# Patient Record
Sex: Female | Born: 1980 | Race: White | Hispanic: No | Marital: Married | Smoking: Former smoker
Health system: Southern US, Community
[De-identification: ages and names within clinical notes are randomized; demographics above are authoritative.]

## PROBLEM LIST (undated history)

## (undated) ENCOUNTER — Inpatient Hospital Stay (HOSPITAL_COMMUNITY): Payer: Self-pay

## (undated) DIAGNOSIS — O409XX0 Polyhydramnios, unspecified trimester, not applicable or unspecified: Secondary | ICD-10-CM

## (undated) DIAGNOSIS — F502 Bulimia nervosa, unspecified: Secondary | ICD-10-CM

## (undated) DIAGNOSIS — Z8619 Personal history of other infectious and parasitic diseases: Secondary | ICD-10-CM

## (undated) DIAGNOSIS — R569 Unspecified convulsions: Secondary | ICD-10-CM

## (undated) DIAGNOSIS — E559 Vitamin D deficiency, unspecified: Secondary | ICD-10-CM

## (undated) DIAGNOSIS — G40909 Epilepsy, unspecified, not intractable, without status epilepticus: Secondary | ICD-10-CM

## (undated) DIAGNOSIS — F419 Anxiety disorder, unspecified: Secondary | ICD-10-CM

## (undated) DIAGNOSIS — C439 Malignant melanoma of skin, unspecified: Secondary | ICD-10-CM

## (undated) DIAGNOSIS — Z789 Other specified health status: Secondary | ICD-10-CM

## (undated) DIAGNOSIS — T1491XA Suicide attempt, initial encounter: Secondary | ICD-10-CM

## (undated) DIAGNOSIS — K5792 Diverticulitis of intestine, part unspecified, without perforation or abscess without bleeding: Secondary | ICD-10-CM

## (undated) HISTORY — DX: Bulimia nervosa, unspecified: F50.20

## (undated) HISTORY — DX: Epilepsy, unspecified, not intractable, without status epilepticus: G40.909

## (undated) HISTORY — DX: Vitamin D deficiency, unspecified: E55.9

## (undated) HISTORY — DX: Personal history of other infectious and parasitic diseases: Z86.19

## (undated) HISTORY — DX: Bulimia nervosa: F50.2

## (undated) HISTORY — DX: Other specified health status: Z78.9

## (undated) HISTORY — DX: Malignant melanoma of skin, unspecified: C43.9

## (undated) HISTORY — DX: Polyhydramnios, unspecified trimester, not applicable or unspecified: O40.9XX0

## (undated) HISTORY — DX: Diverticulitis of intestine, part unspecified, without perforation or abscess without bleeding: K57.92

## (undated) HISTORY — DX: Unspecified convulsions: R56.9

## (undated) HISTORY — DX: Anxiety disorder, unspecified: F41.9

## (undated) HISTORY — DX: Suicide attempt, initial encounter: T14.91XA

---

## 2002-03-23 ENCOUNTER — Emergency Department (HOSPITAL_COMMUNITY): Admission: EM | Admit: 2002-03-23 | Discharge: 2002-03-23 | Payer: Self-pay

## 2002-05-20 ENCOUNTER — Other Ambulatory Visit: Admission: RE | Admit: 2002-05-20 | Discharge: 2002-05-20 | Payer: Self-pay | Admitting: *Deleted

## 2002-08-01 ENCOUNTER — Encounter: Payer: Self-pay | Admitting: Emergency Medicine

## 2002-08-01 ENCOUNTER — Emergency Department (HOSPITAL_COMMUNITY): Admission: EM | Admit: 2002-08-01 | Discharge: 2002-08-01 | Payer: Self-pay | Admitting: Emergency Medicine

## 2003-06-04 ENCOUNTER — Other Ambulatory Visit: Admission: RE | Admit: 2003-06-04 | Discharge: 2003-06-04 | Payer: Self-pay | Admitting: Gynecology

## 2003-10-06 DIAGNOSIS — T1491XA Suicide attempt, initial encounter: Secondary | ICD-10-CM

## 2003-10-06 HISTORY — DX: Suicide attempt, initial encounter: T14.91XA

## 2003-10-09 ENCOUNTER — Inpatient Hospital Stay (HOSPITAL_COMMUNITY): Admission: EM | Admit: 2003-10-09 | Discharge: 2003-10-10 | Payer: Self-pay | Admitting: Emergency Medicine

## 2003-10-10 ENCOUNTER — Inpatient Hospital Stay (HOSPITAL_COMMUNITY): Admission: EM | Admit: 2003-10-10 | Discharge: 2003-10-13 | Payer: Self-pay | Admitting: Psychiatry

## 2004-03-03 ENCOUNTER — Emergency Department (HOSPITAL_COMMUNITY): Admission: EM | Admit: 2004-03-03 | Discharge: 2004-03-03 | Payer: Self-pay | Admitting: Emergency Medicine

## 2004-03-05 DIAGNOSIS — G40909 Epilepsy, unspecified, not intractable, without status epilepticus: Secondary | ICD-10-CM

## 2004-03-05 HISTORY — DX: Epilepsy, unspecified, not intractable, without status epilepticus: G40.909

## 2004-06-05 HISTORY — PX: WISDOM TOOTH EXTRACTION: SHX21

## 2004-06-12 ENCOUNTER — Other Ambulatory Visit: Admission: RE | Admit: 2004-06-12 | Discharge: 2004-06-12 | Payer: Self-pay | Admitting: Gynecology

## 2004-11-24 ENCOUNTER — Emergency Department (HOSPITAL_COMMUNITY): Admission: EM | Admit: 2004-11-24 | Discharge: 2004-11-25 | Payer: Self-pay | Admitting: Emergency Medicine

## 2005-07-26 ENCOUNTER — Other Ambulatory Visit: Admission: RE | Admit: 2005-07-26 | Discharge: 2005-07-26 | Payer: Self-pay | Admitting: Gynecology

## 2005-10-26 ENCOUNTER — Other Ambulatory Visit: Admission: RE | Admit: 2005-10-26 | Discharge: 2005-10-26 | Payer: Self-pay | Admitting: Gynecology

## 2005-11-23 ENCOUNTER — Emergency Department (HOSPITAL_COMMUNITY): Admission: EM | Admit: 2005-11-23 | Discharge: 2005-11-24 | Payer: Self-pay | Admitting: Emergency Medicine

## 2006-07-31 ENCOUNTER — Other Ambulatory Visit: Admission: RE | Admit: 2006-07-31 | Discharge: 2006-07-31 | Payer: Self-pay | Admitting: Gynecology

## 2007-08-04 ENCOUNTER — Other Ambulatory Visit: Admission: RE | Admit: 2007-08-04 | Discharge: 2007-08-04 | Payer: Self-pay | Admitting: Gynecology

## 2007-11-06 DIAGNOSIS — Z789 Other specified health status: Secondary | ICD-10-CM

## 2007-11-06 HISTORY — DX: Other specified health status: Z78.9

## 2008-09-06 ENCOUNTER — Ambulatory Visit: Payer: Self-pay | Admitting: Women's Health

## 2008-09-06 ENCOUNTER — Other Ambulatory Visit: Admission: RE | Admit: 2008-09-06 | Discharge: 2008-09-06 | Payer: Self-pay | Admitting: Gynecology

## 2008-09-06 ENCOUNTER — Encounter: Payer: Self-pay | Admitting: Women's Health

## 2009-09-21 ENCOUNTER — Other Ambulatory Visit: Admission: RE | Admit: 2009-09-21 | Discharge: 2009-09-21 | Payer: Self-pay | Admitting: Gynecology

## 2009-09-21 ENCOUNTER — Ambulatory Visit: Payer: Self-pay | Admitting: Women's Health

## 2010-10-05 ENCOUNTER — Ambulatory Visit: Payer: Self-pay | Admitting: Women's Health

## 2010-10-05 ENCOUNTER — Other Ambulatory Visit
Admission: RE | Admit: 2010-10-05 | Discharge: 2010-10-05 | Payer: Self-pay | Source: Home / Self Care | Admitting: Gynecology

## 2011-02-04 ENCOUNTER — Emergency Department (HOSPITAL_COMMUNITY)
Admission: EM | Admit: 2011-02-04 | Discharge: 2011-02-04 | Disposition: A | Discharge status: Home / Self Care | Payer: 59 | Attending: Emergency Medicine | Admitting: Emergency Medicine

## 2011-02-04 DIAGNOSIS — Z79899 Other long term (current) drug therapy: Secondary | ICD-10-CM | POA: Insufficient documentation

## 2011-02-04 DIAGNOSIS — F329 Major depressive disorder, single episode, unspecified: Secondary | ICD-10-CM | POA: Insufficient documentation

## 2011-02-04 DIAGNOSIS — F3289 Other specified depressive episodes: Secondary | ICD-10-CM | POA: Insufficient documentation

## 2011-02-04 DIAGNOSIS — G40909 Epilepsy, unspecified, not intractable, without status epilepticus: Secondary | ICD-10-CM | POA: Insufficient documentation

## 2011-03-23 NOTE — Discharge Summary (Signed)
NAME:  Natasha Watson, Natasha Watson NO.:  [id number]   MEDICAL RECORD NO.:  [id number]                   PATIENT TYPE:  IPS   LOCATION:  0501                                 FACILITY:  [facility]   PHYSICIAN:  Geoffery Lyons, M.D.                   DATE OF BIRTH:  1981/06/26   DATE OF ADMISSION:  10/10/2003  DATE OF DISCHARGE:  10/13/2003                                 DISCHARGE SUMMARY   CHIEF COMPLAINT AND PRESENT ILLNESS:  This was the first admission to [facility] for this 30 year old white female, single,  voluntarily admitted.  She graduated in May from [employer], juggling two jobs and  a boyfriend.  She got drunk, had a fight with the boyfriend over the  relationship, took and overdose of Motrin, Tylenol, and Prozac because no  reason to live.  She was overwhelmed by life and the job, endorsed  obsessive thinking about weight, how fat she was.  She had to two weeks of  irritability, anhedonia, helplessness, worthlessness.   PAST PSYCHIATRIC HISTORY:  This was the first time at [facility], no current treatment.  She had been treated since she was 54 with  Prozac.   SUBSTANCE ABUSE HISTORY:  As already stated, rare use of alcohol but some  binge drinking.   PAST MEDICAL HISTORY:  Noncontributory.   MEDICATIONS:  Prozac 20 mg daily.   PHYSICAL EXAMINATION:  Physical examination was performed, failed to show  any acute findings.   MENTAL STATUS EXAM:  Mental status exam revealed a fully alert, pleasant  female, tearful, constricted affect.  Speech: Normal rate, production,  tempo.  Mood: Depressed.  Affect: Depressed, tearful, worthless, helpless,  hopeless.  Positive suicidal ruminations with no plan, no homicidal ideas,  no psychosis.  Cognitive: Cognition was well preserved.   ADMISSION DIAGNOSES:   AXIS I:  Major depression, recurrent.   AXIS II:  No diagnosis.   AXIS III:  1. Status post acetaminophen overdose.  2.  Hypokalemia.   AXIS IV:  Moderate.   AXIS V:  Global assessment of functioning upon admission 38, highest global  assessment of functioning in the last year 72.   LABORATORY DATA:  CBC was within normal limits.  Blood chemistries were  within normal limits.  Thyroid profile was within normal limits.  Urine  pregnancy test was negative.   HOSPITAL COURSE:  She was admitted and started in intensive individual and  group psychotherapy.  She was maintained on her home medications.  She was  hypokalemic so potassium was replaced.  She was maintained on Prozac 30 mg  per day, given Ambien for sleep.  Prozac was increased to 40 mg and she was  given Topamax 25 mg at bedtime.  She did say she was feeling very  overwhelmed, depressed for four weeks, increased stress.  After she  graduated, she had been working  long hours, so exacerbation of her eating  disorder with her depression, arguing with the boyfriend.  She had seven  glasses of wine and then took an overdose.  She could see that she was  getting overwhelmed, unable to cope.  As she was able to talk, open up, she  started feeling better.  Family session with the patient, the mother, the  brother, and boyfriend went well.  She worked on finding different coping  mechanisms.  On December 8, she was in full contact with reality, no  suicidal ideas, no homicidal ideas, no hallucinations, no delusions, willing  and motivated to pursue further outpatient treatment.  She had increased  insight, committed to abstinence, willing to work with a therapist to get  the eating disorder under control.   DISCHARGE DIAGNOSES:   AXIS I:  1. Major depression, recurrent.  2. Eating disorder, not otherwise specified.  3. Alcohol abuse.   AXIS II:  No diagnosis.   AXIS III:  1. Status post overdose.  2. Hypokalemia, corrected.   AXIS IV:  Moderate.   AXIS V:  Global assessment of functioning upon discharge 50-55.   DISCHARGE MEDICATIONS:  1.  Prozac 20 mg two daily.  2. Topamax 25 mg one to two at bedtime.   FOLLOW UP:  She was to follow up with Dr. Magnus Ivan and Dr. Raquel James.                                               Geoffery Lyons, M.D.    IL/MEDQ  D:  10/26/2003  T:  10/28/2003  Job:  [id number]

## 2011-03-23 NOTE — H&P (Signed)
NAME:  Natasha Watson, Natasha Watson NO.:  [id number]   MEDICAL RECORD NO.:  [id number]                   PATIENT TYPE:  INP   LOCATION:  1823                                 FACILITY:  [facility]   PHYSICIAN:  Vania Rea, M.D.              DATE OF BIRTH:  09-23-1981   DATE OF ADMISSION:  10/09/2003  DATE OF DISCHARGE:                                HISTORY & PHYSICAL   CHIEF COMPLAINT:  Multi drug overdose.   HISTORY OF PRESENT ILLNESS:  This is a 30 year old Caucasian lady with a  history of depression and boulimia, taking daily Prozac. Last visit to  psychiatry, she says, is 2003 at [facility]. Usually drinks 1 to 2 drinks of  alcohol per month but went out binge drinking last night because of a  breakup with her boyfriend. Had 6 alcoholic drinks, then went home and had  overdosed on multiple medications at about 1:30 a.m. this morning. The  patient informed her friend that she had overdosed with the intent to kill  herself. The friend told her brother, who brought her to the emergency room  within 1 hour. Estimated drug overdose drugs are Tylenol 12 to 20 tablets  x325 mg, Motrin 200 mg 25 to 30 tablets, Midol 4 to 5 tablets, Robitussin  unknown quantity of 220 ml, and unknown quantity of Prozac, and a few  Imodium capsules. In the emergency room, she was drowsy. She was given  activated charcoal. She took 1 mouthful and vomited. The rest of it was  sitting by her bedside. The patient says she still feels suicidal.   PAST MEDICAL HISTORY:  Depression, boulimia, congenital heart murmur.  Admitted for strep throat in 2003.   ALLERGIES:  DARVOCET (vomiting).   MEDICATIONS:  OCP, Prozac 20 mg daily.   SOCIAL HISTORY:  Denies tobacco use. Alcohol 2 drinks every 3 to 4 weeks.  Drugs marijuana, last used 3 weeks ago. 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   FAMILY HISTORY:  There is a strong family history of breast and ovarian  cancer in her  aunts and grandmothers. So far, her mother has been spared.  Her last period was 3 weeks ago. It was normal.   REVIEW OF SYSTEMS:  Noncontributory.   PHYSICAL EXAMINATION:  GENERAL:  She is a drowsy, depressed lady but able to  respond to questions. She is in no respiratory distress. She very often says  I don't want to gain weight. That is not entirely appropriate at times in  the conversation.  VITAL SIGNS:  Temperature 98.7, pulse 98, respiratory rate 18, blood  pressure 110/52. Saturation 99% on room air.  HEENT:  Pupils are equal, round, and reactive to light. She is pink. Mildly  dehydrated.  CHEST:  Clear to auscultation bilaterally.  CARDIOVASCULAR:  Regular rhythm.  ABDOMEN:  Soft, nontender. She has a pierced Higher education careers adviser with studs in it.  EXTREMITIES:  No edema, 3+ pulses.  CNS:  She is drowsy but alert and oriented x3. No focal deficits.   LABORATORY DATA:  Hemoglobin 12.5, hematocrit 37.5, white count 5.4,  platelets 326,000. Chemistry sodium 141, potassium 3.4, chloride 111, CO2  22, BUN 8, creatinine 0.6, calcium 8.6, glucose 105. Urine toxicity  negative. Beta HCG urine negative. Salicylates less than 4. Her  acetaminophen level at 2:35 a.m. was 57.4 and at 4:15 a.m. 68.2. Her alcohol  level at 4:15 was 120. Her liver function studies at 2:35 a.m. and 4:15 a.m.  were all normal and alkaline phosphatase, AST, and ALT were identical and  normal at 51, 23, and 21. A total bilirubin 0.2 and 0.3.   ASSESSMENT:  Multi drug overdose. Needs monitoring for the late effects of  acetaminophen and for the effects of Prozac and Ibuprofen. For the  acetaminophen, she has not really had any charcoal. She has had Imodium,  which slows the transit time in the stomach and her blood levels over the  critical level of 60. Will start her on a 17 dose regimen of Mucomyst now  and admit to the Intensive Care Unit. Will monitor her liver function  studies and her Tylenol level, particularly in  combination with alcohol. For  the Ibuprofen, will start her on IV Protonix and she is drowsy. Will monitor  her hemoglobin and hematocrit every 6 hours initially. For the Prozac, will  monitor her EKG. Currently, her EKG is showing sinus tachycardia. No Q wave  or QT changes at this time.                                                Vania Rea, M.D.    LC/MEDQ  D:  10/09/2003  T:  10/09/2003  Job:  [id number]

## 2011-10-03 DIAGNOSIS — F502 Bulimia nervosa: Secondary | ICD-10-CM | POA: Insufficient documentation

## 2011-10-03 DIAGNOSIS — G40909 Epilepsy, unspecified, not intractable, without status epilepticus: Secondary | ICD-10-CM | POA: Insufficient documentation

## 2011-10-03 DIAGNOSIS — T1491XA Suicide attempt, initial encounter: Secondary | ICD-10-CM | POA: Insufficient documentation

## 2011-10-03 DIAGNOSIS — Z789 Other specified health status: Secondary | ICD-10-CM | POA: Insufficient documentation

## 2011-10-11 ENCOUNTER — Other Ambulatory Visit (HOSPITAL_COMMUNITY)
Admission: RE | Admit: 2011-10-11 | Discharge: 2011-10-11 | Disposition: A | Discharge status: Home / Self Care | Payer: BC Managed Care – PPO | Source: Ambulatory Visit | Attending: Gynecology | Admitting: Gynecology

## 2011-10-11 ENCOUNTER — Ambulatory Visit (INDEPENDENT_AMBULATORY_CARE_PROVIDER_SITE_OTHER): Payer: BC Managed Care – PPO | Admitting: Women's Health

## 2011-10-11 ENCOUNTER — Encounter: Payer: Self-pay | Admitting: Women's Health

## 2011-10-11 VITALS — BP 110/68 | Ht 60.25 in | Wt 126.0 lb

## 2011-10-11 DIAGNOSIS — Z23 Encounter for immunization: Secondary | ICD-10-CM

## 2011-10-11 DIAGNOSIS — Z01419 Encounter for gynecological examination (general) (routine) without abnormal findings: Secondary | ICD-10-CM | POA: Insufficient documentation

## 2011-10-11 NOTE — Progress Notes (Signed)
Addended by: Richardson Chiquito on: 10/11/2011 09:35 AM   Modules accepted: Orders

## 2011-10-11 NOTE — Progress Notes (Signed)
Natasha Watson 29-May-1981 [id number]    History:    The patient presents for annual exam.  Works for an Pensions consultant and doing well.   Past medical history, past surgical history, family history and social history were all reviewed and documented in the EPIC chart.   ROS:  A  ROS was performed and pertinent positives and negatives are included in the history.  Exam:  Filed Vitals:   10/11/11 0804  BP: 110/68    General appearance:  Normal Head/Neck:  Normal, without cervical or supraclavicular adenopathy. Thyroid:  Symmetrical, normal in size, without palpable masses or nodularity. Respiratory  Effort:  Normal  Auscultation:  Clear without wheezing or rhonchi Cardiovascular  Auscultation:  Regular rate, without rubs, murmurs or gallops  Edema/varicosities:  Not grossly evident Abdominal  Soft,nontender, without masses, guarding or rebound.  Liver/spleen:  No organomegaly noted  Hernia:  None appreciated  Skin  Inspection:  Grossly normal  Palpation:  Grossly normal Neurologic/psychiatric  Orientation:  Normal with appropriate conversation.  Mood/affect:  Normal  Genitourinary    Breasts: Examined lying and sitting.     Right: Without masses, retractions, discharge or axillary adenopathy.     Left: Without masses, retractions, discharge or axillary adenopathy.   Inguinal/mons:  Normal without inguinal adenopathy  External genitalia:  Normal  BUS/Urethra/Skene's glands:  Normal  Bladder:  Normal  Vagina:  Normal  Cervix:  Normal  Uterus:  normal in size, shape and contour.  Midline and mobile  Adnexa/parametria:     Rt: Without masses or tenderness.   Lt: Without masses or tenderness.  Anus and perineum: Normal  Digital rectal exam: Normal sphincter tone without palpated masses or tenderness  Assessment/Plan:  30 y.o. MWF G0 for annual exam without complaint. Monthly 3 day cycle/condoms. History of seizures had been seizure-free for years until April 2012 had  one after missing dose of lamictal and increased fatigue. Rubella immune, Gardasil series completed in 07. History of ascus with negative HR HPV in 06, normal Paps after. History of mother with breast cancer at age 21, doing well unknown BRCA status, will discuss with mother. History of depression, no medications, denies need for counseling.  Normal GYN exam Seizure disorder/stable/Dr. Dohmierer  Plan: Contraception options reviewed declines, prefers to continue with condoms. Not ready to conceive but is starting to think about it, as discussed Lamictal, category C. medication with neurologist in relationship to pregnancy.  SBEs, continue exercise and healthy lifestyle, folic acid. CBC and Pap.  Harrington Challenger [facility], 8:47 AM 10/11/2011

## 2012-05-18 ENCOUNTER — Encounter (HOSPITAL_BASED_OUTPATIENT_CLINIC_OR_DEPARTMENT_OTHER): Payer: Self-pay | Admitting: *Deleted

## 2012-05-18 ENCOUNTER — Emergency Department (HOSPITAL_BASED_OUTPATIENT_CLINIC_OR_DEPARTMENT_OTHER)
Admission: EM | Admit: 2012-05-18 | Discharge: 2012-05-19 | Disposition: A | Discharge status: Home / Self Care | Payer: BC Managed Care – PPO | Attending: Emergency Medicine | Admitting: Emergency Medicine

## 2012-05-18 DIAGNOSIS — Z87891 Personal history of nicotine dependence: Secondary | ICD-10-CM | POA: Insufficient documentation

## 2012-05-18 DIAGNOSIS — G40909 Epilepsy, unspecified, not intractable, without status epilepticus: Secondary | ICD-10-CM | POA: Insufficient documentation

## 2012-05-18 DIAGNOSIS — E860 Dehydration: Secondary | ICD-10-CM

## 2012-05-18 DIAGNOSIS — K529 Noninfective gastroenteritis and colitis, unspecified: Secondary | ICD-10-CM

## 2012-05-18 DIAGNOSIS — R1110 Vomiting, unspecified: Secondary | ICD-10-CM | POA: Insufficient documentation

## 2012-05-18 MED ORDER — ONDANSETRON HCL 4 MG/2ML IJ SOLN
4.0000 mg | Freq: Once | INTRAMUSCULAR | Status: AC
  Administered 2012-05-18: 4 mg via INTRAVENOUS
  Filled 2012-05-18: qty 2

## 2012-05-18 MED ORDER — SODIUM CHLORIDE 0.9 % IV BOLUS (SEPSIS)
1000.0000 mL | Freq: Once | INTRAVENOUS | Status: AC
  Administered 2012-05-18: 1000 mL via INTRAVENOUS

## 2012-05-18 NOTE — ED Notes (Signed)
Friday/Sat-diarrhea. Abd pain and vomiting onset early this a.m.

## 2012-05-19 LAB — URINALYSIS, ROUTINE W REFLEX MICROSCOPIC
Glucose, UA: NEGATIVE mg/dL
Ketones, ur: 40 mg/dL — AB
Leukocytes, UA: NEGATIVE
Nitrite: NEGATIVE
Protein, ur: 100 mg/dL — AB
Specific Gravity, Urine: 1.04 — ABNORMAL HIGH (ref 1.005–1.030)
Urobilinogen, UA: 0.2 mg/dL (ref 0.0–1.0)
pH: 6 (ref 5.0–8.0)

## 2012-05-19 LAB — URINE MICROSCOPIC-ADD ON

## 2012-05-19 LAB — PREGNANCY, URINE: Preg Test, Ur: NEGATIVE

## 2012-05-19 MED ORDER — ONDANSETRON 8 MG PO TBDP: 8.0000 mg | ORAL_TABLET | Freq: Three times a day (TID) | ORAL | Status: AC | PRN

## 2012-05-19 MED ORDER — SODIUM CHLORIDE 0.9 % IV BOLUS (SEPSIS): 1000.0000 mL | Freq: Once | INTRAVENOUS | Status: DC

## 2012-05-19 MED ORDER — ONDANSETRON HCL 4 MG/2ML IJ SOLN
4.0000 mg | Freq: Once | INTRAMUSCULAR | Status: AC
  Administered 2012-05-19: 4 mg via INTRAVENOUS
  Filled 2012-05-19: qty 2

## 2012-05-19 NOTE — ED Provider Notes (Signed)
History   This chart was scribed for Hanley Seamen, MD by Shari Heritage. The patient was seen in room MH08/MH08. Patient's care was started at 2307.    CSN: [id number]  Arrival date & time 05/18/12  2307   First MD Initiated Contact with Patient 05/19/12 0015      Chief Complaint  Patient presents with  . Vomiting     (Consider location/radiation/quality/duration/timing/severity/associated sxs/prior treatment) The history is provided by the patient. No language interpreter was used.   Natasha Watson is a 31 y.o. female who presents to the Emergency Department complaining constant, moderate abdominal pain and persistent vomiting onset 24 hours ago. Patient also experienced persistent diarrhea for two days prior to the onset of nausea, vomiting and abdominal pain. Patient says that she has tried to eat crackers and drink PediaLyte, but she hasn't been able to keep anything down. She has had reduced food intake for the past few days. Patient has decreased urine output and states that her urine has been darker than usual. Patient denies SOB, cough, congestion or rhinorrhea. Patient with medical h/o bulimia, attempted suicide, and seizure disorder. Patient is a former smoker (quit date: 10/10/2001).     Past Medical History  Diagnosis Date  . Bulimia     HISTORY OF  . Attempted suicide 10/2003  . Seizure disorder 03/2004    DR. CARMEN DOHMEIER FOLLOWS HER FOR THIS.  . Rubella immune 2009    Past Surgical History  Procedure Date  . Wisdom tooth extraction 06/2004    Family History  Problem Relation Age of Onset  . Breast cancer Mother 7  . Breast cancer Paternal Aunt   . Cancer Paternal Aunt     OVARIAN OR UTERINE  . Breast cancer Maternal Grandmother     History  Substance Use Topics  . Smoking status: Former Smoker    Quit date: 10/10/2001  . Smokeless tobacco: Never Used  . Alcohol Use: Yes     rarely    OB History    Grav Para Term Preterm Abortions TAB SAB  Ect Mult Living   0               Review of Systems A complete 10 system review of systems was obtained and all systems are negative except as noted in the HPI and PMH.   Allergies  Darvocet  Home Medications   Current Outpatient Rx  Name Route Sig Dispense Refill  . BENZOYL PEROXIDE 8 % EX GEL Apply externally Apply topically.      Marland Kitchen FOLIC ACID 800 MCG PO TABS Oral Take 400 mcg by mouth daily.      Marland Kitchen LAMICTAL PO Oral Take 300 mg by mouth daily.       BP 115/66  Pulse 80  Temp 98.3 F (36.8 C) (Oral)  Resp 20  Ht 5' (1.524 m)  Wt 125 lb (56.7 kg)  BMI 24.41 kg/m2  SpO2 100%  LMP 04/28/2012  Physical Exam  Constitutional: She is oriented to person, place, and time. She appears well-developed and well-nourished.  HENT:  Head: Normocephalic.  Eyes: Conjunctivae are normal. Pupils are equal, round, and reactive to light.  Cardiovascular: Normal rate and regular rhythm.   Pulmonary/Chest: Effort normal and breath sounds normal.  Abdominal: Soft. Bowel sounds are decreased. There is generalized tenderness.       Mild diffuse abdominal pain.  Genitourinary:       Urine is dark yellow.  Neurological: She is alert and  oriented to person, place, and time.  Skin: Skin is warm and dry.  Psychiatric: She has a normal mood and affect.    ED Course  Procedures (including critical care time) DIAGNOSTIC STUDIES: Oxygen Saturation is 100% on room air, normal by my interpretation.    COORDINATION OF CARE: 12:16am- Patient informed of current plan for treatment and evaluation and agrees with plan at this time.       MDM   Nursing notes and vitals signs, including pulse oximetry, reviewed.  Summary of this visit's results, reviewed by myself:  Labs:  Results for orders placed during the hospital encounter of 05/18/12  URINALYSIS, ROUTINE W REFLEX MICROSCOPIC      Component Value Range   Color, Urine AMBER (*) YELLOW   APPearance CLOUDY (*) CLEAR   Specific Gravity,  Urine 1.040 (*) 1.005 - 1.030   pH 6.0  5.0 - 8.0   Glucose, UA NEGATIVE  NEGATIVE mg/dL   Hgb urine dipstick TRACE (*) NEGATIVE   Bilirubin Urine SMALL (*) NEGATIVE   Ketones, ur 40 (*) NEGATIVE mg/dL   Protein, ur 119 (*) NEGATIVE mg/dL   Urobilinogen, UA 0.2  0.0 - 1.0 mg/dL   Nitrite NEGATIVE  NEGATIVE   Leukocytes, UA NEGATIVE  NEGATIVE  PREGNANCY, URINE      Component Value Range   Preg Test, Ur NEGATIVE  NEGATIVE  URINE MICROSCOPIC-ADD ON      Component Value Range   Squamous Epithelial / LPF FEW (*) RARE   WBC, UA 0-2  <3 WBC/hpf   RBC / HPF 3-6  <3 RBC/hpf   Urine-Other MUCOUS PRESENT     1:41 AM Drinking fluids without emesis after IV fluid bolus and Zofran.       I personally performed the services described in this documentation, which was scribed in my presence.  The recorded information has been reviewed and considered.    Hanley Seamen, MD 05/19/12 [id number]

## 2012-10-13 ENCOUNTER — Encounter: Payer: BC Managed Care – PPO | Admitting: Women's Health

## 2012-11-06 ENCOUNTER — Ambulatory Visit (INDEPENDENT_AMBULATORY_CARE_PROVIDER_SITE_OTHER): Payer: BC Managed Care – PPO | Admitting: Women's Health

## 2012-11-06 ENCOUNTER — Encounter: Payer: Self-pay | Admitting: Women's Health

## 2012-11-06 VITALS — BP 112/70 | Ht 60.25 in | Wt 127.0 lb

## 2012-11-06 DIAGNOSIS — G40909 Epilepsy, unspecified, not intractable, without status epilepticus: Secondary | ICD-10-CM

## 2012-11-06 DIAGNOSIS — Z01419 Encounter for gynecological examination (general) (routine) without abnormal findings: Secondary | ICD-10-CM

## 2012-11-06 DIAGNOSIS — X838XXA Intentional self-harm by other specified means, initial encounter: Secondary | ICD-10-CM

## 2012-11-06 DIAGNOSIS — T1491XA Suicide attempt, initial encounter: Secondary | ICD-10-CM

## 2012-11-06 DIAGNOSIS — N926 Irregular menstruation, unspecified: Secondary | ICD-10-CM

## 2012-11-06 LAB — TSH: TSH: 2.844 u[IU]/mL (ref 0.350–4.500)

## 2012-11-06 LAB — PROLACTIN: Prolactin: 10.6 ng/mL

## 2012-11-06 NOTE — Patient Instructions (Signed)

## 2012-11-06 NOTE — Assessment & Plan Note (Signed)
2004

## 2012-11-06 NOTE — Progress Notes (Signed)
Natasha Watson 11-22-1980 [id number]    History:    The patient presents for annual exam.  History of a regular monthly 4- 5 day cycle until March 2013. Cycles spaced to every 4-8 weeks. Used condoms until June 2013. Conception okay. History of a seizure disorder on Lamictal 250 mg daily and folic acid 800 daily. Works in [employer] Dr. Hetty Blend manages. Aware Lamictal  category C. drug, plans to decrease dosage with pregnancy. Last seizure April 2012. History of normal Paps. History of bulimia/depression, denies need for counseling or medication. Gardasil series completed 2007. Had normal labs at primary care several months ago.  Past medical history, past surgical history, family history and social history were all reviewed and documented in the EPIC chart. 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 Rubella immune.   ROS:  A  ROS was performed and pertinent positives and negatives are included in the history.  Exam:  Filed Vitals:   11/06/12 0802  BP: 112/70    General appearance:  Normal Head/Neck:  Normal, without cervical or supraclavicular adenopathy. Thyroid:  Symmetrical, normal in size, without palpable masses or nodularity. Respiratory  Effort:  Normal  Auscultation:  Clear without wheezing or rhonchi Cardiovascular  Auscultation:  Regular rate, without rubs, murmurs or gallops  Edema/varicosities:  Not grossly evident Abdominal  Soft,nontender, without masses, guarding or rebound.  Liver/spleen:  No organomegaly noted  Hernia:  None appreciated  Skin  Inspection:  Grossly normal  Palpation:  Grossly normal Neurologic/psychiatric  Orientation:  Normal with appropriate conversation.  Mood/affect:  Normal  Genitourinary    Breasts: Examined lying and sitting.     Right: Without masses, retractions, discharge or axillary adenopathy.     Left: Without masses, retractions, discharge or axillary adenopathy.   Inguinal/mons:  Normal without inguinal  adenopathy  External genitalia:  Normal  BUS/Urethra/Skene's glands:  Normal  Bladder:  Normal  Vagina:  Normal  Cervix:  Normal  Uterus:   normal in size, shape and contour.  Midline and mobile  Adnexa/parametria:     Rt: Without masses or tenderness.   Lt: Without masses or tenderness.  Anus and perineum: Normal  Digital rectal exam: Normal sphincter tone without palpated masses or tenderness  Assessment/Plan:  32 y.o. M. WF G0 for annual exam.    New onset irregular cycles Epilepsy/seizure free since April 2012-Lamictal per neurologist Dr. Hetty Blend  Plan: TSH, prolactin, Pap normal 2012 new screening guidelines reviewed. SBE's, continue exercise and healthy lifestyle, folic acid, prenatal vitamin, return to office with missed cycle for viability ultrasound. Aware we no longer deliver. Aware of safe behaviors with pregnancy. Instructed to keep menstrual calendar, if cycles space greater than 8 weeks instructed to call.   Harrington Challenger [facility], 8:45 AM 11/06/2012

## 2012-11-06 NOTE — Assessment & Plan Note (Signed)
Dr Hetty Blend manages Natasha Watson Lamictal 250

## 2012-11-07 LAB — URINALYSIS W MICROSCOPIC + REFLEX CULTURE
Bacteria, UA: NONE SEEN
Bilirubin Urine: NEGATIVE
Casts: NONE SEEN
Crystals: NONE SEEN
Glucose, UA: NEGATIVE mg/dL
Hgb urine dipstick: NEGATIVE
Ketones, ur: NEGATIVE mg/dL
Leukocytes, UA: NEGATIVE
Nitrite: NEGATIVE
Protein, ur: NEGATIVE mg/dL
Specific Gravity, Urine: 1.029 (ref 1.005–1.030)
Squamous Epithelial / HPF: NONE SEEN
Urobilinogen, UA: 0.2 mg/dL (ref 0.0–1.0)
pH: 5.5 (ref 5.0–8.0)

## 2013-04-09 ENCOUNTER — Ambulatory Visit (INDEPENDENT_AMBULATORY_CARE_PROVIDER_SITE_OTHER): Payer: BC Managed Care – PPO | Admitting: Women's Health

## 2013-04-09 ENCOUNTER — Encounter: Payer: Self-pay | Admitting: Women's Health

## 2013-04-09 DIAGNOSIS — T1491XA Suicide attempt, initial encounter: Secondary | ICD-10-CM

## 2013-04-09 DIAGNOSIS — G40909 Epilepsy, unspecified, not intractable, without status epilepticus: Secondary | ICD-10-CM

## 2013-04-09 DIAGNOSIS — N912 Amenorrhea, unspecified: Secondary | ICD-10-CM

## 2013-04-09 LAB — PREGNANCY, URINE: Preg Test, Ur: POSITIVE

## 2013-04-09 NOTE — Addendum Note (Signed)
Addended by: Aura Camps on: 04/09/2013 09:51 AM   Modules accepted: Orders

## 2013-04-09 NOTE — Progress Notes (Signed)
Patient ID: Natasha Watson, female   DOB: Nov 22, 1980, 32 y.o.   MRN: [id number] Positive home U PT. U PT here today is positive. Unsure of first day of LMP, approximately April 22 which would put her at about 6 weeks today. History of a seizure disorder since 2005 on Lamictal 300. When has decreased dose or increased fatigue with dehydration has had a seizure. Dr. Guss Bunde in [place] is her neurologist and will followup. Denies abdominal pain, discharge, bleeding. Is on a prenatal vitamin and taking extra folic acid. Aware we no longer deliver and plans to schedule OB care at [facility]. . Happy with pregnancy.  Exam: Appears well,  Early gestation with seizure disorder on Lamictal  Plan: viability ultrasound next week for dating. Schedule appointment for 2 weeks at [facility]. Continue Lamictal, is aware it is a category C. Drug, but reviewed seizures are more problematic then medication. Reviewed importance of increasing rest and staying well hydrated. Congratulations given.

## 2013-04-17 ENCOUNTER — Encounter: Payer: Self-pay | Admitting: Women's Health

## 2013-04-17 ENCOUNTER — Ambulatory Visit (INDEPENDENT_AMBULATORY_CARE_PROVIDER_SITE_OTHER): Payer: BC Managed Care – PPO | Admitting: Women's Health

## 2013-04-17 ENCOUNTER — Ambulatory Visit (INDEPENDENT_AMBULATORY_CARE_PROVIDER_SITE_OTHER): Payer: BC Managed Care – PPO

## 2013-04-17 DIAGNOSIS — O3680X9 Pregnancy with inconclusive fetal viability, other fetus: Secondary | ICD-10-CM

## 2013-04-17 DIAGNOSIS — N912 Amenorrhea, unspecified: Secondary | ICD-10-CM

## 2013-04-17 DIAGNOSIS — R112 Nausea with vomiting, unspecified: Secondary | ICD-10-CM

## 2013-04-17 LAB — US OB TRANSVAGINAL

## 2013-04-17 MED ORDER — ONDANSETRON HCL 4 MG PO TABS: 4.0000 mg | ORAL_TABLET | Freq: Three times a day (TID) | ORAL | Status: DC | PRN

## 2013-04-17 NOTE — Progress Notes (Signed)
Patient ID: Natasha Watson, female   DOB: 12-19-1980, 32 y.o.   MRN: [id number] Presents for viability ultrasound. LMP 02/24/13, [redacted]w[redacted]d/per dates. Having some problems with nausea with vomiting for past several days, increased fatigue and poor sleep due to nausea. Seizure disorder on Lamictal.  Ultrasound confirms IUP at 6 w1d  which will change EDC to 12/10/2013. Intrauterine gestational sac seen with yolk sac. Fetal pole with fetal heart tone. Ovaries appear normal. No free fluid noted.  Early gestation with nausea and some vomiting Seizure disorder on Lamictal  Plan: Zofran 4 mg every 8 hours as needed, reviewed importance of increasing rest and fluids. Is aware of importance in relationship of rest and hydration to seizure prevention. Has scheduled new OB appointment at [facility] next week will keep scheduled appointment. Instructed to call if vomiting increases to more than twice daily, or no relief with Zofran. Copy of ultrasound given, congratulations given. Continue prenatal vitamin, ways to decrease nausea discussed. Aware of safe pregnancy behaviors.

## 2013-04-22 ENCOUNTER — Other Ambulatory Visit: Payer: Self-pay | Admitting: Women's Health

## 2013-04-22 MED ORDER — DOXYLAMINE-PYRIDOXINE 10-10 MG PO TBEC: 10.0000 mg | DELAYED_RELEASE_TABLET | Freq: Two times a day (BID) | ORAL | Status: DC | PRN

## 2013-04-22 NOTE — Progress Notes (Signed)
TC states continues with some nausea and vomiting mostly in the afternoon. States is able to sleep and is sleeping most nights 9 hours. Does have scheduled new OB appointment this week.  No seizures. States is using Zofran minimally but most afternoons needs. Reviewed will call in a safer medicine that she can alternate - Diclegis.

## 2013-04-23 LAB — OB RESULTS CONSOLE HIV ANTIBODY (ROUTINE TESTING): HIV: NONREACTIVE

## 2013-04-23 LAB — OB RESULTS CONSOLE RPR: RPR: NONREACTIVE

## 2013-04-23 LAB — OB RESULTS CONSOLE GC/CHLAMYDIA
Chlamydia: NEGATIVE
Gonorrhea: NEGATIVE

## 2013-04-23 LAB — OB RESULTS CONSOLE ANTIBODY SCREEN: Antibody Screen: NEGATIVE

## 2013-04-23 LAB — OB RESULTS CONSOLE RUBELLA ANTIBODY, IGM: RUBELLA: IMMUNE

## 2013-04-23 LAB — OB RESULTS CONSOLE HEPATITIS B SURFACE ANTIGEN: Hepatitis B Surface Ag: NEGATIVE

## 2013-04-23 LAB — OB RESULTS CONSOLE ABO/RH: RH TYPE: NEGATIVE

## 2013-10-19 ENCOUNTER — Inpatient Hospital Stay (HOSPITAL_COMMUNITY): Admission: AD | Admit: 2013-10-19 | Payer: Self-pay | Source: Ambulatory Visit | Admitting: Obstetrics and Gynecology

## 2013-11-09 ENCOUNTER — Encounter: Payer: BC Managed Care – PPO | Admitting: Women's Health

## 2013-11-11 ENCOUNTER — Encounter: Payer: Self-pay | Admitting: Women's Health

## 2013-11-13 LAB — OB RESULTS CONSOLE GBS: GBS: NEGATIVE

## 2013-11-24 ENCOUNTER — Other Ambulatory Visit (HOSPITAL_COMMUNITY): Payer: Self-pay | Admitting: Obstetrics and Gynecology

## 2013-11-24 ENCOUNTER — Ambulatory Visit (HOSPITAL_COMMUNITY)
Admission: RE | Admit: 2013-11-24 | Discharge: 2013-11-24 | Disposition: A | Discharge status: Home / Self Care | Payer: BC Managed Care – PPO | Source: Ambulatory Visit | Attending: Obstetrics and Gynecology | Admitting: Obstetrics and Gynecology

## 2013-11-24 DIAGNOSIS — M79661 Pain in right lower leg: Secondary | ICD-10-CM

## 2013-11-24 DIAGNOSIS — R200 Anesthesia of skin: Secondary | ICD-10-CM

## 2013-11-24 DIAGNOSIS — M7989 Other specified soft tissue disorders: Secondary | ICD-10-CM | POA: Insufficient documentation

## 2013-11-24 DIAGNOSIS — M79609 Pain in unspecified limb: Secondary | ICD-10-CM | POA: Insufficient documentation

## 2013-11-24 DIAGNOSIS — O99891 Other specified diseases and conditions complicating pregnancy: Secondary | ICD-10-CM | POA: Insufficient documentation

## 2013-11-24 DIAGNOSIS — O9989 Other specified diseases and conditions complicating pregnancy, childbirth and the puerperium: Principal | ICD-10-CM

## 2013-11-24 NOTE — Progress Notes (Addendum)
*  PRELIMINARY RESULTS* Vascular Ultrasound Right lower extremity venous duplex has been completed.  Preliminary findings: No evidence of DVT on the right. The right common femoral vein flow is minimal and more continuous compared to the left. This could be due to pregnancy.  Called report to Dr. Ronita Hipps.    Landry Mellow, RDMS, RVT  11/24/2013, 12:35 PM

## 2013-11-27 ENCOUNTER — Telehealth (HOSPITAL_COMMUNITY): Payer: Self-pay | Admitting: *Deleted

## 2013-11-27 ENCOUNTER — Encounter (HOSPITAL_COMMUNITY): Payer: Self-pay | Admitting: *Deleted

## 2013-11-27 NOTE — Telephone Encounter (Signed)
Preadmission screen  

## 2013-12-01 ENCOUNTER — Other Ambulatory Visit: Payer: Self-pay | Admitting: Obstetrics and Gynecology

## 2013-12-03 ENCOUNTER — Encounter (HOSPITAL_COMMUNITY): Payer: Self-pay

## 2013-12-03 ENCOUNTER — Inpatient Hospital Stay (HOSPITAL_COMMUNITY)
Admission: RE | Admit: 2013-12-03 | Payer: BC Managed Care – PPO | Source: Ambulatory Visit | Admitting: Obstetrics and Gynecology

## 2013-12-03 ENCOUNTER — Inpatient Hospital Stay (HOSPITAL_COMMUNITY)
Admission: RE | Admit: 2013-12-03 | Discharge: 2013-12-06 | DRG: 765 | Disposition: A | Discharge status: Home / Self Care | Payer: BC Managed Care – PPO | Source: Ambulatory Visit | Attending: Obstetrics and Gynecology | Admitting: Obstetrics and Gynecology

## 2013-12-03 DIAGNOSIS — O409XX0 Polyhydramnios, unspecified trimester, not applicable or unspecified: Secondary | ICD-10-CM | POA: Diagnosis present

## 2013-12-03 DIAGNOSIS — O360990 Maternal care for other rhesus isoimmunization, unspecified trimester, not applicable or unspecified: Secondary | ICD-10-CM | POA: Diagnosis present

## 2013-12-03 DIAGNOSIS — O358XX0 Maternal care for other (suspected) fetal abnormality and damage, not applicable or unspecified: Secondary | ICD-10-CM | POA: Diagnosis present

## 2013-12-03 DIAGNOSIS — O99354 Diseases of the nervous system complicating childbirth: Secondary | ICD-10-CM | POA: Diagnosis present

## 2013-12-03 DIAGNOSIS — O323XX0 Maternal care for face, brow and chin presentation, not applicable or unspecified: Secondary | ICD-10-CM | POA: Diagnosis present

## 2013-12-03 DIAGNOSIS — F502 Bulimia nervosa: Secondary | ICD-10-CM

## 2013-12-03 DIAGNOSIS — Z789 Other specified health status: Secondary | ICD-10-CM

## 2013-12-03 DIAGNOSIS — T1491XA Suicide attempt, initial encounter: Secondary | ICD-10-CM

## 2013-12-03 DIAGNOSIS — G40909 Epilepsy, unspecified, not intractable, without status epilepticus: Secondary | ICD-10-CM | POA: Diagnosis present

## 2013-12-03 DIAGNOSIS — Z87891 Personal history of nicotine dependence: Secondary | ICD-10-CM

## 2013-12-03 LAB — CBC
HCT: 33.2 % — ABNORMAL LOW (ref 36.0–46.0)
Hemoglobin: 11.4 g/dL — ABNORMAL LOW (ref 12.0–15.0)
MCH: 30.6 pg (ref 26.0–34.0)
MCHC: 34.3 g/dL (ref 30.0–36.0)
MCV: 89.2 fL (ref 78.0–100.0)
Platelets: 189 K/uL (ref 150–400)
RBC: 3.72 MIL/uL — ABNORMAL LOW (ref 3.87–5.11)
RDW: 12.4 % (ref 11.5–15.5)
WBC: 9.7 K/uL (ref 4.0–10.5)

## 2013-12-03 MED ORDER — ACETAMINOPHEN 325 MG PO TABS: 650.0000 mg | ORAL_TABLET | ORAL | Status: DC | PRN

## 2013-12-03 MED ORDER — FLEET ENEMA 7-19 GM/118ML RE ENEM: 1.0000 | ENEMA | Freq: Once | RECTAL | Status: DC

## 2013-12-03 MED ORDER — OXYCODONE-ACETAMINOPHEN 5-325 MG PO TABS: 1.0000 | ORAL_TABLET | ORAL | Status: DC | PRN

## 2013-12-03 MED ORDER — LACTATED RINGERS IV SOLN
500.0000 mL | INTRAVENOUS | Status: DC | PRN
  Administered 2013-12-04: 500 mL via INTRAVENOUS

## 2013-12-03 MED ORDER — MISOPROSTOL 25 MCG QUARTER TABLET
25.0000 ug | ORAL_TABLET | ORAL | Status: DC | PRN
  Administered 2013-12-03: 25 ug via VAGINAL
  Filled 2013-12-03 (×2): qty 0.25

## 2013-12-03 MED ORDER — CITRIC ACID-SODIUM CITRATE 334-500 MG/5ML PO SOLN
30.0000 mL | ORAL | Status: DC | PRN
  Administered 2013-12-04: 30 mL via ORAL
  Filled 2013-12-03 (×3): qty 15

## 2013-12-03 MED ORDER — IBUPROFEN 600 MG PO TABS: 600.0000 mg | ORAL_TABLET | Freq: Four times a day (QID) | ORAL | Status: DC | PRN

## 2013-12-03 MED ORDER — OXYTOCIN BOLUS FROM INFUSION
500.0000 mL | INTRAVENOUS | Status: DC
Start: 2013-12-03 — End: 2013-12-05

## 2013-12-03 MED ORDER — TERBUTALINE SULFATE 1 MG/ML IJ SOLN: 0.2500 mg | Freq: Once | INTRAMUSCULAR | Status: AC | PRN

## 2013-12-03 MED ORDER — LACTATED RINGERS IV SOLN
INTRAVENOUS | Status: DC
  Administered 2013-12-04 (×3): via INTRAVENOUS

## 2013-12-03 MED ORDER — OXYTOCIN 40 UNITS IN LACTATED RINGERS INFUSION - SIMPLE MED: 62.5000 mL/h | INTRAVENOUS | Status: DC

## 2013-12-03 MED ORDER — ZOLPIDEM TARTRATE 5 MG PO TABS
5.0000 mg | ORAL_TABLET | Freq: Every evening | ORAL | Status: DC | PRN
  Administered 2013-12-03: 5 mg via ORAL
  Filled 2013-12-03: qty 1

## 2013-12-03 MED ORDER — LIDOCAINE HCL (PF) 1 % IJ SOLN: 30.0000 mL | INTRAMUSCULAR | Status: DC | PRN

## 2013-12-03 MED ORDER — ONDANSETRON HCL 4 MG/2ML IJ SOLN: 4.0000 mg | Freq: Four times a day (QID) | INTRAMUSCULAR | Status: DC | PRN

## 2013-12-03 NOTE — H&P (Signed)
Natasha Watson is a 33 y.o. female presenting for IOL @ 39 weeks due to polyhydramnios and new onset left fetal pyelectasis. PMH: epilepsy on lamictal History OB History   Grav Para Term Preterm Abortions TAB SAB Ect Mult Living   1              Past Medical History  Diagnosis Date  . Bulimia     HISTORY OF  . Attempted suicide 10/2003  . Seizure disorder 03/2004    DR. CARMEN Watson FOLLOWS HER FOR THIS.  . Rubella immune 2009  . Hx of varicella   . Polyhydramnios   . Seizures    Past Surgical History  Procedure Laterality Date  . Wisdom tooth extraction  06/2004   Family History: family history includes Breast cancer in her maternal grandmother and paternal aunt; Breast cancer (age of onset: 11) in her mother; Cancer in her paternal aunt; Heart disease in her maternal grandfather; Hyperlipidemia in her maternal grandfather. Social History:  reports that she quit smoking about 12 years ago. She has never used smokeless tobacco. She reports that she drinks alcohol. She reports that she does not use illicit drugs.   Prenatal Transfer Tool  Maternal Diabetes: No Genetic Screening: Normal Maternal Ultrasounds/Referrals: Normal Fetal Ultrasounds or other Referrals:  None Maternal Substance Abuse:  No Significant Maternal Medications:  Meds include: Other: lamictal Significant Maternal Lab Results:  Lab values include: Group B Strep negative, Rh negative Other Comments:  epilepsy on lamictaL New onset left fetal pyelectasis  ROS neg  Dilation: Fingertip Station: Ballotable Exam by:: Natasha Nix RN Blood pressure 119/77, pulse 83, temperature 98.2 F (36.8 C), temperature source Oral, resp. rate 18, height 5' (1.524 m), weight 72.122 kg (159 lb), last menstrual period 02/24/2013. Maternal Exam:  Abdomen: Patient reports no abdominal tenderness. Estimated fetal weight is 7lb.   Fetal presentation: vertex  Introitus: Normal vulva.   Physical Exam  Constitutional: She is  oriented to person, place, and time. She appears well-developed and well-nourished.  Eyes: EOM are normal.  Neck: Neck supple.  Cardiovascular: Regular rhythm.   Respiratory: Breath sounds normal.  GI: Soft.  Musculoskeletal: She exhibits edema.  Neurological: She is alert and oriented to person, place, and time.  Skin: Skin is warm and dry.  Psychiatric: She has a normal mood and affect.    Prenatal labs: ABO, Rh: A/Negative/-- (06/19 0000) Antibody: Negative (06/19 0000) Rubella: Immune (06/19 0000) RPR: Nonreactive (06/19 0000)  HBsAg: Negative (06/19 0000)  HIV: Non-reactive (06/19 0000)  GBS: Negative (01/09 0000)   Assessment/Plan: Polyhydramnios Term gestation Epilepsy Rh negative  P) admit routine labs. Cont lamictal. Cytotec. Pitocin in am. Rhophylac if indicated  Natasha Watson A 12/03/2013, 10:20 PM

## 2013-12-04 ENCOUNTER — Encounter (HOSPITAL_COMMUNITY): Payer: Self-pay

## 2013-12-04 ENCOUNTER — Inpatient Hospital Stay (HOSPITAL_COMMUNITY): Payer: BC Managed Care – PPO | Admitting: Anesthesiology

## 2013-12-04 ENCOUNTER — Encounter (HOSPITAL_COMMUNITY): Payer: BC Managed Care – PPO | Admitting: Anesthesiology

## 2013-12-04 ENCOUNTER — Encounter (HOSPITAL_COMMUNITY)
Admission: RE | Disposition: A | Discharge status: Home / Self Care | Payer: Self-pay | Source: Ambulatory Visit | Attending: Obstetrics and Gynecology

## 2013-12-04 LAB — RPR: RPR Ser Ql: NONREACTIVE

## 2013-12-04 SURGERY — Surgical Case
Anesthesia: Regional | Site: Abdomen

## 2013-12-04 MED ORDER — FENTANYL 2.5 MCG/ML BUPIVACAINE 1/10 % EPIDURAL INFUSION (WH - ANES)
14.0000 mL/h | INTRAMUSCULAR | Status: DC | PRN
  Filled 2013-12-04 (×2): qty 125

## 2013-12-04 MED ORDER — SODIUM BICARBONATE 8.4 % IV SOLN
INTRAVENOUS | Status: AC
  Filled 2013-12-04: qty 50

## 2013-12-04 MED ORDER — LAMOTRIGINE 100 MG PO TABS
300.0000 mg | ORAL_TABLET | Freq: Every day | ORAL | Status: DC
  Administered 2013-12-04: 300 mg via ORAL

## 2013-12-04 MED ORDER — EPHEDRINE 5 MG/ML INJ
10.0000 mg | INTRAVENOUS | Status: DC | PRN
  Administered 2013-12-04: 10 mg via INTRAVENOUS

## 2013-12-04 MED ORDER — CEFAZOLIN SODIUM-DEXTROSE 2-3 GM-% IV SOLR
INTRAVENOUS | Status: AC
  Filled 2013-12-04: qty 50

## 2013-12-04 MED ORDER — LIDOCAINE-EPINEPHRINE (PF) 2 %-1:200000 IJ SOLN
INTRAMUSCULAR | Status: AC
  Filled 2013-12-04: qty 20

## 2013-12-04 MED ORDER — CEFAZOLIN SODIUM-DEXTROSE 2-3 GM-% IV SOLR: INTRAVENOUS | Status: DC | PRN

## 2013-12-04 MED ORDER — FENTANYL 2.5 MCG/ML BUPIVACAINE 1/10 % EPIDURAL INFUSION (WH - ANES)
INTRAMUSCULAR | Status: DC | PRN
  Administered 2013-12-04: 14 mL/h via EPIDURAL
  Administered 2013-12-04: 12:00:00

## 2013-12-04 MED ORDER — BUPIVACAINE HCL (PF) 0.25 % IJ SOLN
INTRAMUSCULAR | Status: DC | PRN
  Administered 2013-12-04: 6 mL

## 2013-12-04 MED ORDER — LACTATED RINGERS IV SOLN: INTRAVENOUS | Status: DC

## 2013-12-04 MED ORDER — SCOPOLAMINE 1 MG/3DAYS TD PT72
1.0000 | MEDICATED_PATCH | Freq: Once | TRANSDERMAL | Status: DC
  Administered 2013-12-04: 1.5 mg via TRANSDERMAL

## 2013-12-04 MED ORDER — PHENYLEPHRINE HCL 10 MG/ML IJ SOLN
INTRAMUSCULAR | Status: DC | PRN
  Administered 2013-12-04 (×3): 80 ug via INTRAVENOUS

## 2013-12-04 MED ORDER — CEFAZOLIN SODIUM-DEXTROSE 2-3 GM-% IV SOLR
INTRAVENOUS | Status: DC | PRN
  Administered 2013-12-04: 2 g via INTRAVENOUS

## 2013-12-04 MED ORDER — ONDANSETRON HCL 4 MG/2ML IJ SOLN
INTRAMUSCULAR | Status: AC
  Filled 2013-12-04: qty 4

## 2013-12-04 MED ORDER — KETOROLAC TROMETHAMINE 60 MG/2ML IM SOLN
INTRAMUSCULAR | Status: AC
  Filled 2013-12-04: qty 2

## 2013-12-04 MED ORDER — HYDROMORPHONE HCL PF 1 MG/ML IJ SOLN: 0.2500 mg | INTRAMUSCULAR | Status: DC | PRN

## 2013-12-04 MED ORDER — PROMETHAZINE HCL 25 MG/ML IJ SOLN: 6.2500 mg | INTRAMUSCULAR | Status: DC | PRN

## 2013-12-04 MED ORDER — OXYTOCIN 10 UNIT/ML IJ SOLN
INTRAMUSCULAR | Status: AC
  Filled 2013-12-04: qty 4

## 2013-12-04 MED ORDER — OXYTOCIN 40 UNITS IN LACTATED RINGERS INFUSION - SIMPLE MED
INTRAVENOUS | Status: DC | PRN
  Administered 2013-12-04: 40 [IU] via INTRAVENOUS

## 2013-12-04 MED ORDER — DIPHENHYDRAMINE HCL 50 MG/ML IJ SOLN: 12.5000 mg | INTRAMUSCULAR | Status: DC | PRN

## 2013-12-04 MED ORDER — MORPHINE SULFATE 0.5 MG/ML IJ SOLN
INTRAMUSCULAR | Status: AC
  Filled 2013-12-04: qty 10

## 2013-12-04 MED ORDER — OXYTOCIN 40 UNITS IN LACTATED RINGERS INFUSION - SIMPLE MED
1.0000 m[IU]/min | INTRAVENOUS | Status: DC
  Administered 2013-12-04 (×2): 2 m[IU]/min via INTRAVENOUS
  Filled 2013-12-04: qty 1000

## 2013-12-04 MED ORDER — EPHEDRINE 5 MG/ML INJ
10.0000 mg | INTRAVENOUS | Status: DC | PRN
  Filled 2013-12-04: qty 4

## 2013-12-04 MED ORDER — PHENYLEPHRINE 40 MCG/ML (10ML) SYRINGE FOR IV PUSH (FOR BLOOD PRESSURE SUPPORT)
80.0000 ug | PREFILLED_SYRINGE | INTRAVENOUS | Status: DC | PRN
  Filled 2013-12-04: qty 10

## 2013-12-04 MED ORDER — MORPHINE SULFATE (PF) 0.5 MG/ML IJ SOLN
INTRAMUSCULAR | Status: DC | PRN
  Administered 2013-12-04: 4 mg via EPIDURAL

## 2013-12-04 MED ORDER — BUTORPHANOL TARTRATE 1 MG/ML IJ SOLN
1.0000 mg | INTRAMUSCULAR | Status: DC | PRN
  Administered 2013-12-04: 1 mg via INTRAVENOUS
  Filled 2013-12-04: qty 1

## 2013-12-04 MED ORDER — MEPERIDINE HCL 25 MG/ML IJ SOLN: 6.2500 mg | INTRAMUSCULAR | Status: DC | PRN

## 2013-12-04 MED ORDER — LIDOCAINE-EPINEPHRINE (PF) 2 %-1:200000 IJ SOLN
INTRAMUSCULAR | Status: DC | PRN
  Administered 2013-12-04 (×2): 5 mL via EPIDURAL

## 2013-12-04 MED ORDER — PHENYLEPHRINE 40 MCG/ML (10ML) SYRINGE FOR IV PUSH (FOR BLOOD PRESSURE SUPPORT)
PREFILLED_SYRINGE | INTRAVENOUS | Status: AC
  Filled 2013-12-04: qty 10

## 2013-12-04 MED ORDER — ONDANSETRON HCL 4 MG/2ML IJ SOLN
INTRAMUSCULAR | Status: DC | PRN
  Administered 2013-12-04: 4 mg via INTRAVENOUS

## 2013-12-04 MED ORDER — PHENYLEPHRINE 40 MCG/ML (10ML) SYRINGE FOR IV PUSH (FOR BLOOD PRESSURE SUPPORT): 80.0000 ug | PREFILLED_SYRINGE | INTRAVENOUS | Status: DC | PRN

## 2013-12-04 MED ORDER — LACTATED RINGERS IV SOLN
500.0000 mL | Freq: Once | INTRAVENOUS | Status: AC
  Administered 2013-12-04: 500 mL via INTRAVENOUS

## 2013-12-04 MED ORDER — LACTATED RINGERS IV SOLN
INTRAVENOUS | Status: DC
  Administered 2013-12-04: 11:00:00 via INTRAUTERINE

## 2013-12-04 MED ORDER — BUPIVACAINE HCL (PF) 0.25 % IJ SOLN
INTRAMUSCULAR | Status: AC
  Filled 2013-12-04: qty 30

## 2013-12-04 MED ORDER — LIDOCAINE HCL (PF) 1 % IJ SOLN
INTRAMUSCULAR | Status: DC | PRN
  Administered 2013-12-04 (×2): 9 mL

## 2013-12-04 MED ORDER — KETOROLAC TROMETHAMINE 60 MG/2ML IM SOLN
60.0000 mg | Freq: Once | INTRAMUSCULAR | Status: AC | PRN
  Administered 2013-12-04: 60 mg via INTRAMUSCULAR

## 2013-12-04 MED ORDER — SCOPOLAMINE 1 MG/3DAYS TD PT72
MEDICATED_PATCH | TRANSDERMAL | Status: AC
  Filled 2013-12-04: qty 1

## 2013-12-04 SURGICAL SUPPLY — 45 items
APL SKNCLS STERI-STRIP NONHPOA (GAUZE/BANDAGES/DRESSINGS)
BARRIER ADHS 3X4 INTERCEED (GAUZE/BANDAGES/DRESSINGS) ×3 IMPLANT
BENZOIN TINCTURE PRP APPL 2/3 (GAUZE/BANDAGES/DRESSINGS) IMPLANT
BRR ADH 4X3 ABS CNTRL BYND (GAUZE/BANDAGES/DRESSINGS) ×1
CLAMP CORD UMBIL (MISCELLANEOUS) ×2 IMPLANT
CLOSURE WOUND 1/2 X4 (GAUZE/BANDAGES/DRESSINGS)
CLOTH BEACON ORANGE TIMEOUT ST (SAFETY) ×3 IMPLANT
CONTAINER PREFILL 10% NBF 15ML (MISCELLANEOUS) IMPLANT
DRAPE LG THREE QUARTER DISP (DRAPES) IMPLANT
DRSG OPSITE POSTOP 4X10 (GAUZE/BANDAGES/DRESSINGS) ×3 IMPLANT
DURAPREP 26ML APPLICATOR (WOUND CARE) ×3 IMPLANT
ELECT REM PT RETURN 9FT ADLT (ELECTROSURGICAL) ×3
ELECTRODE REM PT RTRN 9FT ADLT (ELECTROSURGICAL) ×1 IMPLANT
EXTRACTOR VACUUM M CUP 4 TUBE (SUCTIONS) ×1 IMPLANT
EXTRACTOR VACUUM M CUP 4' TUBE (SUCTIONS) ×1
GLOVE BIOGEL PI IND STRL 7.0 (GLOVE) ×1 IMPLANT
GLOVE BIOGEL PI INDICATOR 7.0 (GLOVE) ×2
GLOVE ECLIPSE 6.5 STRL STRAW (GLOVE) ×3 IMPLANT
GOWN STRL REUS W/TWL LRG LVL3 (GOWN DISPOSABLE) ×6 IMPLANT
KIT ABG SYR 3ML LUER SLIP (SYRINGE) IMPLANT
NDL HYPO 25X1 1.5 SAFETY (NEEDLE) ×1 IMPLANT
NDL HYPO 25X5/8 SAFETYGLIDE (NEEDLE) IMPLANT
NEEDLE HYPO 25X1 1.5 SAFETY (NEEDLE) ×3 IMPLANT
NEEDLE HYPO 25X5/8 SAFETYGLIDE (NEEDLE) IMPLANT
NS IRRIG 1000ML POUR BTL (IV SOLUTION) ×3 IMPLANT
PACK C SECTION WH (CUSTOM PROCEDURE TRAY) ×3 IMPLANT
PAD OB MATERNITY 4.3X12.25 (PERSONAL CARE ITEMS) ×3 IMPLANT
RTRCTR C-SECT PINK 25CM LRG (MISCELLANEOUS) IMPLANT
STAPLER VISISTAT 35W (STAPLE) IMPLANT
STRIP CLOSURE SKIN 1/2X4 (GAUZE/BANDAGES/DRESSINGS) IMPLANT
SUT CHROMIC GUT AB #0 18 (SUTURE) IMPLANT
SUT MNCRL 0 VIOLET CTX 36 (SUTURE) ×3 IMPLANT
SUT MON AB 4-0 PS1 27 (SUTURE) ×2 IMPLANT
SUT MONOCRYL 0 CTX 36 (SUTURE) ×6
SUT PLAIN 2 0 (SUTURE)
SUT PLAIN 2 0 XLH (SUTURE) ×2 IMPLANT
SUT PLAIN ABS 2-0 CT1 27XMFL (SUTURE) IMPLANT
SUT VIC AB 0 CT1 36 (SUTURE) ×6 IMPLANT
SUT VIC AB 2-0 CT1 27 (SUTURE) ×3
SUT VIC AB 2-0 CT1 TAPERPNT 27 (SUTURE) ×1 IMPLANT
SUT VIC AB 4-0 PS2 27 (SUTURE) IMPLANT
SYR CONTROL 10ML LL (SYRINGE) ×3 IMPLANT
TOWEL OR 17X24 6PK STRL BLUE (TOWEL DISPOSABLE) ×3 IMPLANT
TRAY FOLEY CATH 14FR (SET/KITS/TRAYS/PACK) IMPLANT
WATER STERILE IRR 1000ML POUR (IV SOLUTION) ×3 IMPLANT

## 2013-12-04 NOTE — Transfer of Care (Signed)
Immediate Anesthesia Transfer of Care Note  Patient: Natasha Watson  Procedure(s) Performed: Procedure(s): CESAREAN SECTION (N/A)  Patient Location: PACU  Anesthesia Type:Epidural  Level of Consciousness: awake, alert  and oriented  Airway & Oxygen Therapy: Patient Spontanous Breathing  Post-op Assessment: Report given to PACU RN and Post -op Vital signs reviewed and stable  Post vital signs: Reviewed and stable  Complications: No apparent anesthesia complications

## 2013-12-04 NOTE — Progress Notes (Signed)
Dr Garwin Brothers notified of FHR tracing with increased baseline, interventions provided, SVE with continued face presentation, UC pattern, MVU and amt of pitocin. Provider aware, no orders provided at this time.

## 2013-12-04 NOTE — Progress Notes (Signed)
Natasha Watson is a 33 y.o. G1P0 at [redacted]w[redacted]d by LMP admitted for induction of labor due to Hydramnios.Marland Kitchen PMH notable for epilepsy controlled with lamictal. GBS cx neg. Last sono: left pyelectasis, polyhydramnios and AGA baby.   Subjective: No chief complaint on file. s/p cytotec x 1 Not perceiving ctx  Objective: BP 106/69  Pulse 81  Temp(Src) 98.3 F (36.8 C) (Oral)  Resp 18  Ht 5' (1.524 m)  Wt 72.122 kg (159 lb)  BMI 31.05 kg/m2  LMP 02/24/2013      FHT:  FHR: 150 bpm, variability: moderate,  accelerations:  Present,  decelerations:  Absent UC:   regular, every 2 minutes SVE:   1-2 cm dilated, 40% effaced, -3 station. Intracervical balloon placed Tracing: cat 1  Labs: Lab Results  Component Value Date   WBC 9.7 12/03/2013   HGB 11.4* 12/03/2013   HCT 33.2* 12/03/2013   MCV 89.2 12/03/2013   PLT 189 12/03/2013    Assessment / Plan: Polyhydramnios Epilepsy on lamictal stable Term gestation P) start pitocin if ctx not spaced.   Anticipated MOD:  unsure  Natasha Watson A 12/04/2013, 12:44 AM

## 2013-12-04 NOTE — Anesthesia Procedure Notes (Signed)
Epidural Patient location during procedure: OB Start time: 12/04/2013 3:56 AM End time: 12/04/2013 4:00 AM  Staffing Anesthesiologist: Lyn Hollingshead Performed by: anesthesiologist   Preanesthetic Checklist Completed: patient identified, site marked, surgical consent, pre-op evaluation, timeout performed, IV checked, risks and benefits discussed and monitors and equipment checked  Epidural Patient position: sitting Prep: site prepped and draped and DuraPrep Patient monitoring: continuous pulse ox and blood pressure Approach: midline Injection technique: LOR air  Needle:  Needle type: Tuohy  Needle gauge: 17 G Needle length: 9 cm and 9 Needle insertion depth: 4 cm Catheter type: closed end flexible Catheter size: 19 Gauge Catheter at skin depth: 9 cm Test dose: negative and Other  Assessment Sensory level: T9 Events: blood not aspirated, injection not painful, no injection resistance, negative IV test and no paresthesia

## 2013-12-04 NOTE — OR Nursing (Signed)
FHR 150'S IN OR  2115

## 2013-12-04 NOTE — Anesthesia Preprocedure Evaluation (Signed)
Anesthesia Evaluation  Patient identified by MRN, date of birth, ID band Patient awake    Reviewed: Allergy & Precautions, H&P , NPO status , Patient's Chart, lab work & pertinent test results  Airway Mallampati: II TM Distance: >3 FB Neck ROM: full    Dental no notable dental hx.    Pulmonary neg pulmonary ROS, former smoker,    Pulmonary exam normal       Cardiovascular negative cardio ROS      Neuro/Psych    GI/Hepatic negative GI ROS, Neg liver ROS,   Endo/Other  negative endocrine ROS  Renal/GU negative Renal ROS     Musculoskeletal   Abdominal Normal abdominal exam  (+)   Peds  Hematology negative hematology ROS (+)   Anesthesia Other Findings   Reproductive/Obstetrics (+) Pregnancy                           Anesthesia Physical Anesthesia Plan  ASA: II  Anesthesia Plan:    Post-op Pain Management:    Induction:   Airway Management Planned:   Additional Equipment:   Intra-op Plan:   Post-operative Plan:   Informed Consent: I have reviewed the patients History and Physical, chart, labs and discussed the procedure including the risks, benefits and alternatives for the proposed anesthesia with the patient or authorized representative who has indicated his/her understanding and acceptance.     Plan Discussed with:   Anesthesia Plan Comments:         Anesthesia Quick Evaluation

## 2013-12-04 NOTE — Progress Notes (Signed)
Natasha Watson is a 33 y.o. G1P0 at [redacted]w[redacted]d by LMP admitted for induction of labor due to Hydramnios.  Subjective: No chief complaint on file.  comforable since epidural. Off Pitocin due to prolonged deceleration with good recovery now SROM clear fluid @ 4:30 am along with foley bulb  Objective: BP 112/65  Pulse 76  Temp(Src) 98.9 F (37.2 C) (Oral)  Resp 18  Ht 5' (1.524 m)  Wt 72.122 kg (159 lb)  BMI 31.05 kg/m2  SpO2 100%  LMP 02/24/2013 I/O last 3 completed shifts: In: -  Out: 800 [Urine:800]    FHT:  FHR: 125 bpm, variability: marked,  accelerations:  Present,  decelerations:  Present deceleration x 4 mins down to 70's with good variability during pre and post  UC:   regular, every 3 minutes SVE:   4 cm dilated, 80% effaced, -2 station Left  Mental anterior presentation Tracing: cat 1 now  Labs: Lab Results  Component Value Date   WBC 9.7 12/03/2013   HGB 11.4* 12/03/2013   HCT 33.2* 12/03/2013   MCV 89.2 12/03/2013   PLT 189 12/03/2013    Assessment / Plan: Labor Mental presentation Term  seizure disorder P) exaggerated left sims position. Resume pitocin @4MIU                                                   Watch tracing and monitor fetal position progress  Anticipated MOD:  NSVD  Natasha Watson A 12/04/2013, 9:32 AM

## 2013-12-04 NOTE — Brief Op Note (Signed)
12/03/2013 - 12/04/2013  10:47 PM  PATIENT:  Natasha Watson  33 y.o. female  PRE-OPERATIVE DIAGNOSIS:  mentum presentation, seizure disorder, arrest of dilation  POST-OPERATIVE DIAGNOSIS:  mentum presentation, arrest of dilation, arrest of dilation  PROCEDURE:  Primary cesarean section Buddy Duty hysterotomy  SURGEON:  Surgeon(s) and Role:    * Suly Vukelich A Noel Rodier, MD - Primary  PHYSICIAN ASSISTANT:   ASSISTANTS: Laury Deep, CNM   ANESTHESIA:   epidural Findings:  Hyperextended right mentum transverse live female, Apgar 8/9, nl tubes and ovaries cord around left foot EBL:  Total I/O In: 1900 [I.V.:1900] Out: 1300 [Urine:600; Blood:700]  BLOOD ADMINISTERED:none  DRAINS: none   LOCAL MEDICATIONS USED:  MARCAINE     SPECIMEN:  No Specimen  DISPOSITION OF SPECIMEN:  N/A  COUNTS:  YES  TOURNIQUET:  * No tourniquets in log *  DICTATION: .Other Dictation: Dictation Number                                                                                                                                                                                 [id number]                                                                                   PLAN OF CARE: Admit to inpatient   PATIENT DISPOSITION:  PACU - hemodynamically stable.   Delay start of Pharmacological VTE agent (>24hrs) due to surgical blood loss or risk of bleeding: no

## 2013-12-04 NOTE — Progress Notes (Signed)
S: denies pelvic pressure Per RN last exam feels like face presentation   O: Epidural Pitocin 18 miu VE > 5/90%/-3 left mentum transverse/ant   tracing reviewed: baseline 150 (+) accel great variability Ctx q 2-3 mins MV units 200  IMP: Persistent mentum presentation without marked cervical change despite adequate labor for several hours Seizure disorder on med Polyhydramnios  With left fetal pyelectasis P) recommend primary C/S. Risk of C/S reviewed including infection, bleeding, injury to bladder, bowels, ureter, internal scar tissue, future mode of delivery disc, poss blood transfusion and its risk( HIV, hepatitis, fever etc). All ? Answered. Consent signed. OR  notified

## 2013-12-04 NOTE — Progress Notes (Signed)
Dr Garwin Brothers notiifed of FHR tracing, interventions provided, attempt ISFE, SVE with palpation of nose, orbit and fontanel, and pit off. No orders given at this time.

## 2013-12-05 ENCOUNTER — Encounter (HOSPITAL_COMMUNITY): Payer: Self-pay

## 2013-12-05 LAB — CBC
HCT: 26.2 % — ABNORMAL LOW (ref 36.0–46.0)
Hemoglobin: 8.9 g/dL — ABNORMAL LOW (ref 12.0–15.0)
MCH: 30.6 pg (ref 26.0–34.0)
MCHC: 34 g/dL (ref 30.0–36.0)
MCV: 90 fL (ref 78.0–100.0)
Platelets: 149 10*3/uL — ABNORMAL LOW (ref 150–400)
RBC: 2.91 MIL/uL — ABNORMAL LOW (ref 3.87–5.11)
RDW: 12.5 % (ref 11.5–15.5)
WBC: 13.7 10*3/uL — AB (ref 4.0–10.5)

## 2013-12-05 MED ORDER — NALBUPHINE HCL 10 MG/ML IJ SOLN: 5.0000 mg | INTRAMUSCULAR | Status: DC | PRN

## 2013-12-05 MED ORDER — DIBUCAINE 1 % RE OINT: 1.0000 "application " | TOPICAL_OINTMENT | RECTAL | Status: DC | PRN

## 2013-12-05 MED ORDER — NALOXONE HCL 0.4 MG/ML IJ SOLN: 0.4000 mg | INTRAMUSCULAR | Status: DC | PRN

## 2013-12-05 MED ORDER — BISACODYL 10 MG RE SUPP: 10.0000 mg | Freq: Every day | RECTAL | Status: DC | PRN

## 2013-12-05 MED ORDER — METHYLERGONOVINE MALEATE 0.2 MG/ML IJ SOLN: 0.2000 mg | INTRAMUSCULAR | Status: DC | PRN

## 2013-12-05 MED ORDER — SODIUM CHLORIDE 0.9 % IJ SOLN: 3.0000 mL | INTRAMUSCULAR | Status: DC | PRN

## 2013-12-05 MED ORDER — RHO D IMMUNE GLOBULIN 1500 UNIT/2ML IJ SOLN
300.0000 ug | Freq: Once | INTRAMUSCULAR | Status: AC
  Administered 2013-12-05: 300 ug via INTRAMUSCULAR
  Filled 2013-12-05: qty 2

## 2013-12-05 MED ORDER — OXYTOCIN 40 UNITS IN LACTATED RINGERS INFUSION - SIMPLE MED: 62.5000 mL/h | INTRAVENOUS | Status: AC

## 2013-12-05 MED ORDER — OXYCODONE-ACETAMINOPHEN 5-325 MG PO TABS
1.0000 | ORAL_TABLET | ORAL | Status: DC | PRN
  Administered 2013-12-06: 1 via ORAL
  Filled 2013-12-05: qty 1

## 2013-12-05 MED ORDER — DIPHENHYDRAMINE HCL 50 MG/ML IJ SOLN: 12.5000 mg | INTRAMUSCULAR | Status: DC | PRN

## 2013-12-05 MED ORDER — PRENATAL MULTIVITAMIN CH
1.0000 | ORAL_TABLET | Freq: Every day | ORAL | Status: DC
  Administered 2013-12-05 – 2013-12-06 (×2): 1 via ORAL
  Filled 2013-12-05 (×2): qty 1

## 2013-12-05 MED ORDER — DIPHENHYDRAMINE HCL 50 MG/ML IJ SOLN: 25.0000 mg | INTRAMUSCULAR | Status: DC | PRN

## 2013-12-05 MED ORDER — METHYLERGONOVINE MALEATE 0.2 MG PO TABS: 0.2000 mg | ORAL_TABLET | ORAL | Status: DC | PRN

## 2013-12-05 MED ORDER — SIMETHICONE 80 MG PO CHEW
80.0000 mg | CHEWABLE_TABLET | ORAL | Status: DC
  Filled 2013-12-05: qty 1

## 2013-12-05 MED ORDER — SODIUM CHLORIDE 0.9 % IJ SOLN: 3.0000 mL | Freq: Two times a day (BID) | INTRAMUSCULAR | Status: DC

## 2013-12-05 MED ORDER — KETOROLAC TROMETHAMINE 30 MG/ML IJ SOLN
30.0000 mg | Freq: Four times a day (QID) | INTRAMUSCULAR | Status: AC | PRN
  Administered 2013-12-05: 30 mg via INTRAVENOUS
  Filled 2013-12-05: qty 1

## 2013-12-05 MED ORDER — IBUPROFEN 600 MG PO TABS
600.0000 mg | ORAL_TABLET | Freq: Four times a day (QID) | ORAL | Status: DC
  Administered 2013-12-05 – 2013-12-06 (×5): 600 mg via ORAL
  Filled 2013-12-05 (×6): qty 1

## 2013-12-05 MED ORDER — SENNOSIDES-DOCUSATE SODIUM 8.6-50 MG PO TABS
2.0000 | ORAL_TABLET | ORAL | Status: DC
  Administered 2013-12-05: 2 via ORAL
  Filled 2013-12-05: qty 2

## 2013-12-05 MED ORDER — METOCLOPRAMIDE HCL 5 MG/ML IJ SOLN: 10.0000 mg | Freq: Three times a day (TID) | INTRAMUSCULAR | Status: DC | PRN

## 2013-12-05 MED ORDER — ZOLPIDEM TARTRATE 5 MG PO TABS: 5.0000 mg | ORAL_TABLET | Freq: Every evening | ORAL | Status: DC | PRN

## 2013-12-05 MED ORDER — KETOROLAC TROMETHAMINE 30 MG/ML IJ SOLN: 30.0000 mg | Freq: Four times a day (QID) | INTRAMUSCULAR | Status: AC | PRN

## 2013-12-05 MED ORDER — DIPHENHYDRAMINE HCL 25 MG PO CAPS: 25.0000 mg | ORAL_CAPSULE | Freq: Four times a day (QID) | ORAL | Status: DC | PRN

## 2013-12-05 MED ORDER — SODIUM CHLORIDE 0.9 % IV SOLN: 250.0000 mL | INTRAVENOUS | Status: DC

## 2013-12-05 MED ORDER — ONDANSETRON HCL 4 MG/2ML IJ SOLN: 4.0000 mg | Freq: Three times a day (TID) | INTRAMUSCULAR | Status: DC | PRN

## 2013-12-05 MED ORDER — ONDANSETRON HCL 4 MG PO TABS: 4.0000 mg | ORAL_TABLET | ORAL | Status: DC | PRN

## 2013-12-05 MED ORDER — FLEET ENEMA 7-19 GM/118ML RE ENEM: 1.0000 | ENEMA | Freq: Every day | RECTAL | Status: DC | PRN

## 2013-12-05 MED ORDER — LAMOTRIGINE 150 MG PO TABS
300.0000 mg | ORAL_TABLET | Freq: Every day | ORAL | Status: DC
  Filled 2013-12-05: qty 2

## 2013-12-05 MED ORDER — LANOLIN HYDROUS EX OINT: 1.0000 "application " | TOPICAL_OINTMENT | CUTANEOUS | Status: DC | PRN

## 2013-12-05 MED ORDER — WITCH HAZEL-GLYCERIN EX PADS: 1.0000 "application " | MEDICATED_PAD | CUTANEOUS | Status: DC | PRN

## 2013-12-05 MED ORDER — ONDANSETRON HCL 4 MG/2ML IJ SOLN: 4.0000 mg | INTRAMUSCULAR | Status: DC | PRN

## 2013-12-05 MED ORDER — SIMETHICONE 80 MG PO CHEW
80.0000 mg | CHEWABLE_TABLET | Freq: Three times a day (TID) | ORAL | Status: DC
  Administered 2013-12-05 – 2013-12-06 (×3): 80 mg via ORAL
  Filled 2013-12-05 (×3): qty 1

## 2013-12-05 MED ORDER — FERROUS SULFATE 325 (65 FE) MG PO TABS
325.0000 mg | ORAL_TABLET | Freq: Two times a day (BID) | ORAL | Status: DC
  Administered 2013-12-05 – 2013-12-06 (×3): 325 mg via ORAL
  Filled 2013-12-05 (×3): qty 1

## 2013-12-05 MED ORDER — LAMOTRIGINE 150 MG PO TABS
300.0000 mg | ORAL_TABLET | Freq: Every day | ORAL | Status: DC
  Administered 2013-12-05: 300 mg via ORAL

## 2013-12-05 MED ORDER — NALOXONE HCL 1 MG/ML IJ SOLN
1.0000 ug/kg/h | INTRAVENOUS | Status: DC | PRN
  Filled 2013-12-05: qty 2

## 2013-12-05 MED ORDER — MENTHOL 3 MG MT LOZG: 1.0000 | LOZENGE | OROMUCOSAL | Status: DC | PRN

## 2013-12-05 MED ORDER — SIMETHICONE 80 MG PO CHEW
80.0000 mg | CHEWABLE_TABLET | ORAL | Status: DC | PRN
  Administered 2013-12-05: 80 mg via ORAL

## 2013-12-05 MED ORDER — DIPHENHYDRAMINE HCL 25 MG PO CAPS: 25.0000 mg | ORAL_CAPSULE | ORAL | Status: DC | PRN

## 2013-12-05 NOTE — Anesthesia Postprocedure Evaluation (Signed)
Anesthesia Post Note  Patient: Natasha Watson  Procedure(s) Performed: Procedure(s) (LRB): CESAREAN SECTION (N/A)  Anesthesia type: Epidural  Patient location: PACU  Post pain: Pain level controlled  Post assessment: Post-op Vital signs reviewed  Last Vitals: BP 112/67  Pulse 64  Temp(Src) 37 C (Axillary)  Resp 20  Ht 5' (1.524 m)  Wt 159 lb (72.122 kg)  BMI 31.05 kg/m2  SpO2 93%  LMP 02/24/2013  Post vital signs: Reviewed  Level of consciousness: sedated  Complications: No apparent anesthesia complications

## 2013-12-05 NOTE — Op Note (Signed)
NAMEBRAILEY, BUESCHER NO.:  [id number]  MEDICAL RECORD NO.:  [id number]  LOCATION:  9126                          FACILITY:  [facility]  PHYSICIAN:  Servando Salina, M.D.DATE OF BIRTH:  November 25, 1980  DATE OF PROCEDURE:  12/04/2013 DATE OF DISCHARGE:                              OPERATIVE REPORT   PREOPERATIVE DIAGNOSIS:  Mentum presentation, arrest of dilatation, term gestation.  PROCEDURE:  Primary cesarean section, Kerr hysterotomy.  POSTOPERATIVE DIAGNOSES:  Right transverse mentum presentation, arrest of dilatation, term gestation.  ANESTHESIA:  Epidural.  SURGEON:  Servando Salina, M.D.  ASSISTANT:  Laury Deep, CNM.  PROCEDURE IN DETAIL:  Under adequate epidural anesthesia, the patient was placed in the supine position with left lateral tilt.  An indwelling Foley catheter was already in place.  The patient was sterilely prepped and draped in usual fashion.  A 0.25% Marcaine was injected along the planned Pfannenstiel skin incision.  Pfannenstiel skin incision was then made and carried down to the rectus fascia.  Rectus fascia was opened transversely.  The rectus fascia then bluntly and sharply dissected off the rectus muscle in superior and inferior fashion.  The rectus muscle was split in midline.  The parietal peritoneum was entered sharply and extended.  The vesicouterine peritoneum was opened transversely.  The bladder was then bluntly dissected off the low uterine segment and displaced inferiorly with a bladder retractor.  A curvilinear low- transverse uterine incision was then made and extended with bandage scissors.  Subsequent delivery of a live female from a right hyperextended mentum presentation was accomplished ultimately by using the vacuum with de-flexing the head.  Cord around the foot was noted.  The baby was bulb suctioned in the abdomen.  Cord was clamped and cut.  The baby was transferred to the awaiting pediatrician, who assigned  Apgars of 9 and 9 at 1 and 5 minutes.  Cord blood donation was done, was spontaneously removed intact, not sent to Pathology.  Uterine cavity was cleaned of debris.  Uterine incision had no extension, was closed in 2 layers, the first layer with 0 Monocryl running locked stitch, second layer was imbricated using 0 Monocryl suture.  Small hematoma noted on the right lateral edge of that uterus was contained.  Normal tubes and ovaries were noted bilaterally.  The abdomen was copiously irrigated and suctioned of debris.  Interceed was placed over the lower uterine segment.  The parietal peritoneum was closed with 2-0 Vicryl.  The subcutaneous areas approximated with 2-0 plain sutures and skin approximated using 4-0 Monocryl suture.  The patient tolerated procedure well and was transferred to recovery in stable condition.  SPECIMEN:  Placenta, not sent to Pathology.  ESTIMATED BLOOD LOSS:  700 mL.  INTRAOPERATIVE FLUID:  1900 mL.  URINE OUTPUT:  350 mL.  COUNTS:  Sponge and instrument counts x2 was correct.  COMPLICATION:  None.     Servando Salina, M.D.     [place]/MEDQ  D:  12/04/2013  T:  12/05/2013  Job:  [id number]

## 2013-12-05 NOTE — Anesthesia Postprocedure Evaluation (Signed)
  Anesthesia Post-op Note  Patient: Natasha Watson  Procedure(s) Performed: Procedure(s): CESAREAN SECTION (N/A)  Patient Location: PACU and Mother/Baby  Anesthesia Type:Epidural  Level of Consciousness: awake, alert , oriented and patient cooperative  Airway and Oxygen Therapy: Patient Spontanous Breathing  Post-op Pain: none  Post-op Assessment: Post-op Vital signs reviewed  Post-op Vital Signs: Reviewed and stable  Complications: No apparent anesthesia complications

## 2013-12-05 NOTE — Progress Notes (Signed)
Patient ID: NAKEA GOUGER, female   DOB: Jan 06, 1981, 33 y.o.   MRN: [id number] INTERVAL NOTE:  S:   Sitting in bed doing skin-to-skin with infant, min cramping, (+) voids, small bleed, denies HA/NV/dizziness  O:   VSS, AAO x 3, NAD  Fundus @ U  Scant lochia  A / P:   PPD #0  Stable post partum  Routine PP orders  Graceann Congress, MSN, CNM 12/05/2013, 10:04 AM

## 2013-12-05 NOTE — Progress Notes (Signed)
S: comfortable  O: pitocin 9 miu Epidural VE deferred BP 107/52 Tracing reviewed: baseline 130 Ctx q 2-5 mins  IMP: Suboptimal labor pattern Polyhdramnios Seizure disorder  Term gestation P) reviewed plan with pt and  Husband: defer exam until adequate labor pattern. If no change with adequate labor need C/S. Pt advised in office

## 2013-12-05 NOTE — Progress Notes (Signed)
Clinical Social Work Department PSYCHOSOCIAL ASSESSMENT - MATERNAL/CHILD 12/05/2013  Patient:  Natasha Watson, Natasha Watson  Account Number:  [id number]  Admit Date:  12/03/2013  Ardine Eng Name:   Natasha Watson    Clinical Social Worker:  Sufyan Meidinger, LCSW   Date/Time:  12/05/2013 10:30 AM  Date Referred:  12/05/2013   Referral source  Central Nursery     Referred reason  Depression/Anxiety   Other referral source:    I:  FAMILY / [place] legal guardian:  PARENT  Guardian - Name Guardian - Age Guardian - Address  Lannom,Caylah [address]  [place], [place] [address]  Loura Pardon  same as above   Other household support members/support persons Other support:    II  PSYCHOSOCIAL DATA Information Source:    Occupational hygienist Employment:   Parents employed   Museum/gallery curator resources:  Multimedia programmer If [place] / Grade:   Maternity Care Coordinator / Child Services Coordination / Early Interventions:  Cultural issues impacting care:    III  STRENGTHS Strengths  Supportive family/friends  Home prepared for Child (including basic supplies)  Adequate Resources   Strength comment:    IV  RISK FACTORS AND CURRENT PROBLEMS Current Problem:       V  SOCIAL WORK ASSESSMENT Acknowledged order for Social Work consult to assess mother's history of depression. Mother was receptive to social work intervention.  Spouse was present and attentive to mother and newborn.  Mother states that she is concerned about PP Depression.  Informed that she has friends with hx of PP Depression.  She was in counseling in the past and currently lamictal for seizures and mother states it also helps with the depression.  She denies any current symptoms of depression.  She also denies any use of alcohol of illicit drug use during pregnancy.  Discussed signs/symptoms of PP depression.  Mother was very receptive to the information.    Provided her with  literature and treatment resources if needed.  She reports adequate family support. Mother informed of social work availability      [place] Work Plan  No Further Intervention Required / No Barriers to Discharge

## 2013-12-06 MED ORDER — FERROUS SULFATE 325 (65 FE) MG PO TABS: 325.0000 mg | ORAL_TABLET | Freq: Two times a day (BID) | ORAL | Status: DC

## 2013-12-06 MED ORDER — OXYCODONE-ACETAMINOPHEN 5-325 MG PO TABS: 1.0000 | ORAL_TABLET | ORAL | Status: DC | PRN

## 2013-12-06 MED ORDER — IBUPROFEN 600 MG PO TABS: 600.0000 mg | ORAL_TABLET | Freq: Four times a day (QID) | ORAL | Status: DC | PRN

## 2013-12-06 MED ORDER — DOCUSATE SODIUM 100 MG PO CAPS: 100.0000 mg | ORAL_CAPSULE | Freq: Two times a day (BID) | ORAL | Status: DC | PRN

## 2013-12-06 NOTE — Anesthesia Postprocedure Evaluation (Signed)
  Anesthesia Post-op Note  Patient: Natasha Watson  Procedure(s) Performed: Procedure(s): CESAREAN SECTION (N/A)  Patient Location: Mother/Baby  Anesthesia Type:Epidural  Level of Consciousness: awake  Airway and Oxygen Therapy: Patient Spontanous Breathing  Post-op Pain: none  Post-op Assessment: Patient's Cardiovascular Status Stable, Respiratory Function Stable, Patent Airway, No signs of Nausea or vomiting, Adequate PO intake, Pain level controlled, No headache, No backache, No residual numbness and No residual motor weakness  Post-op Vital Signs: Reviewed and stable  Complications: No apparent anesthesia complications

## 2013-12-06 NOTE — Discharge Summary (Signed)
Obstetric Discharge Summary Reason for Admission: G1 P0 for IOL @ 39 weeks due to polyhydramnios and new onset left fetal pyelectasis. Hx epilepsy, stable on Lamictal  Prenatal Procedures: NST and ultrasound Intrapartum Procedures: cesarean: low cervical, transverse Postpartum Procedures: none Complications-Operative and Postpartum: none Hemoglobin  Date Value Range Status  12/05/2013 8.9* 12.0 - 15.0 g/dL Final     REPEATED TO VERIFY     DELTA CHECK NOTED     HCT  Date Value Range Status  12/05/2013 26.2* 36.0 - 46.0 % Final    Physical Exam:  General: alert, cooperative and no distress Lochia: appropriate Uterine Fundus: firm Incision: healing well DVT Evaluation: No evidence of DVT seen on physical exam. Negative Homan's sign.  Discharge Diagnoses: Term gestation delivered. S/p Primary C/S for mentum presentation and arrest of dilation  Discharge Information: Date: 12/06/2013 Activity: pelvic rest Diet: routine Medications: PNV, Ibuprofen, Colace, Iron and Percocet Condition: stable Instructions: refer to practice specific booklet Discharge to: home Follow-up Information   Follow up with Jesselee Poth A, MD In 6 weeks.   Specialty:  Obstetrics and Gynecology   Contact information:   [address] [place] [address] [phone]       Newborn Data: Live born female on 12/04/13 Birth Weight: 7 lb 6.9 oz (3371 g) APGAR: 8, 9  Home with mother.  FISHER,JULIE K 12/06/2013, 11:14 AM

## 2013-12-06 NOTE — Progress Notes (Signed)
Patient ID: Natasha Watson, female   DOB: 02-03-1981, 33 y.o.   MRN: [id number] POD # 2  Subjective: Pt reports feeling well, but some difficulty getting in and out of bed/ Pain poorly controlled with ibuprofen and has only taken 1 percocet Tolerating po/Voiding without problems/ No n/v/Flatus pos Activity: out of bed and ambulate Bleeding is light Newborn info:  Information for the patient's newborn:  Natasha, Watson [[id number]]  female  / circ performed per Dr Cousins/ Feeding: breast   Objective: VS: Blood pressure 102/65, pulse 98, temperature 97.9 F (36.6 C), temperature source Oral, resp. rate 18.   LABS:  Recent Labs  12/03/13 2030 12/05/13 0544  WBC 9.7 13.7*  HGB 11.4* 8.9*  PLT 189 149*                             Physical Exam:  General: alert, cooperative and no distress CV: Regular rate and rhythm Resp: clear Abdomen: soft, nontender, normal bowel sounds Incision: well approximated, closed with sutures Uterine Fundus: firm, below umbilicus, nontender Lochia: minimal Ext: edema +2 and Homans sign is negative, no sign of DVT    A/P: POD # 3/ G1P1001/ S/P C/Section d/t mentum presentation/arrest of descent ABL Anemia Doing well and stable for early discharge home RX's: Ibuprofen 600mg  po Q 6 hrs prn pain #30 Refill x 1 Percocet 5/325 1 - 2 tabs po every 6 hrs prn pain  #30 No refill Niferex 150mg  po QD/BID #30/#60 Refill x 1 Colace 100mg  po up to TID prn #30 Ref x 1 Rt pp visit in 6 wks    Signed: Darleen Crocker, MSN, [facility] 12/06/2013, 11:10 AM

## 2013-12-07 ENCOUNTER — Encounter (HOSPITAL_COMMUNITY): Payer: Self-pay | Admitting: Obstetrics and Gynecology

## 2013-12-07 LAB — TYPE AND SCREEN
ABO/RH(D): A NEG
ANTIBODY SCREEN: POSITIVE
DAT, IgG: NEGATIVE
UNIT DIVISION: 0
Unit division: 0

## 2013-12-07 LAB — RH IG WORKUP (INCLUDES ABO/RH)
ABO/RH(D): A NEG
FETAL SCREEN: NEGATIVE
Gestational Age(Wks): 39.1
Unit division: 0

## 2013-12-13 ENCOUNTER — Inpatient Hospital Stay (HOSPITAL_COMMUNITY)
Admission: AD | Admit: 2013-12-13 | Discharge: 2013-12-13 | Disposition: A | Discharge status: Home / Self Care | Payer: BC Managed Care – PPO | Source: Ambulatory Visit | Attending: Obstetrics and Gynecology | Admitting: Obstetrics and Gynecology

## 2013-12-13 ENCOUNTER — Encounter (HOSPITAL_COMMUNITY): Payer: Self-pay | Admitting: *Deleted

## 2013-12-13 DIAGNOSIS — O909 Complication of the puerperium, unspecified: Secondary | ICD-10-CM | POA: Insufficient documentation

## 2013-12-13 DIAGNOSIS — T148XXA Other injury of unspecified body region, initial encounter: Secondary | ICD-10-CM

## 2013-12-13 DIAGNOSIS — Z87891 Personal history of nicotine dependence: Secondary | ICD-10-CM | POA: Insufficient documentation

## 2013-12-13 DIAGNOSIS — L24A9 Irritant contact dermatitis due friction or contact with other specified body fluids: Secondary | ICD-10-CM

## 2013-12-13 MED ORDER — OXYCODONE-ACETAMINOPHEN 5-325 MG PO TABS: 1.0000 | ORAL_TABLET | ORAL | Status: DC | PRN

## 2013-12-13 NOTE — Discharge Instructions (Signed)
Stitches, Staples, or Skin Adhesive Strips  °Stitches (sutures), staples, and skin adhesive strips hold the skin together as it heals. They will usually be in place for 7 days or less. °HOME CARE °· Wash your hands with soap and water before and after you touch your wound. °· Only take medicine as told by your doctor. °· Cover your wound only if your doctor told you to. Otherwise, leave it open to air. °· Do not get your stitches wet or dirty. If they get dirty, dab them gently with a clean washcloth. Wet the washcloth with soapy water. Do not rub. Pat them dry gently. °· Do not put medicine or medicated cream on your stitches unless your doctor told you to. °· Do not take out your own stitches or staples. Skin adhesive strips will fall off by themselves. °· Do not pick at the wound. Picking can cause an infection. °· Do not miss your follow-up appointment. °· If you have problems or questions, call your doctor. °GET HELP RIGHT AWAY IF:  °· You have a temperature by mouth above 102° F (38.9° C), not controlled by medicine. °· You have chills. °· You have redness or pain around your stitches. °· There is puffiness (swelling) around your stitches. °· You notice fluid (drainage) from your stitches. °· There is a bad smell coming from your wound. °MAKE SURE YOU: °· Understand these instructions. °· Will watch your condition. °· Will get help if you are not doing well or get worse. °Document Released: 08/19/2009 Document Revised: 01/14/2012 Document Reviewed: 08/19/2009 °[employer]® Patient Information ©2014 [employer]. ° °

## 2013-12-13 NOTE — MAU Note (Signed)
Pt presents with complaints of her cesarean section scar possibly having a stitch pop loose. She states she jumped up from bed because her baby was crying and thinks she might have popped it loose.

## 2013-12-13 NOTE — MAU Provider Note (Signed)
History     CSN: [id number]  Arrival date and time: 12/13/13 1411 Provider on unit: 1450 Provider at bedside: 1509     Chief Complaint  Patient presents with  . "Popped a stitch"- post cesarean section    HPI  Ms. Natasha Watson is a 33 yo G2P1001 female who is S/P cesarean delivery (on 12/04/2013) presenting today with c/o  feeling like she "popped a stitch" on Tuesday after being startled and jolting up in the bed.  She reports that the pain has progressively gotten worse since Tuesday.  Denies active drainage, no redness, not warm to the touch.    Past Medical History  Diagnosis Date  . Bulimia     HISTORY OF  . Attempted suicide 10/2003  . Seizure disorder 03/2004    DR. CARMEN DOHMEIER FOLLOWS HER FOR THIS.  . Rubella immune 2009  . Hx of varicella   . Polyhydramnios   . Seizures   . Postpartum care following cesarean delivery (1/30) 12/05/2013    Past Surgical History  Procedure Laterality Date  . Wisdom tooth extraction  06/2004  . Cesarean section N/A 12/04/2013    Procedure: CESAREAN SECTION;  Surgeon: Marvene Staff, MD;  Location: [facility];  Service: Obstetrics;  Laterality: N/A;    Family History  Problem Relation Age of Onset  . Breast cancer Mother 73  . Breast cancer Paternal Aunt   . Cancer Paternal Aunt     OVARIAN OR UTERINE  . Breast cancer Maternal Grandmother   . Heart disease Maternal Grandfather   . Hyperlipidemia Maternal Grandfather     History  Substance Use Topics  . Smoking status: Former Smoker    Quit date: 10/10/2001  . Smokeless tobacco: Never Used  . Alcohol Use: Yes     Comment: rarely    Allergies:  Allergies  Allergen Reactions  . Darvocet [Propoxyphene N-Acetaminophen] Nausea And Vomiting    Prescriptions prior to admission  Medication Sig Dispense Refill  . docusate sodium (COLACE) 100 MG capsule Take 1 capsule (100 mg total) by mouth 2 (two) times daily as needed.  30 capsule  1  . ferrous sulfate 325 (65  FE) MG tablet Take 1 tablet (325 mg total) by mouth 2 (two) times daily with a meal.  60 tablet  1  . folic acid (FOLVITE) 1 MG tablet Take 4 mg by mouth daily.      Marland Kitchen ibuprofen (ADVIL,MOTRIN) 600 MG tablet Take 1 tablet (600 mg total) by mouth every 6 (six) hours as needed.  30 tablet  1  . LamoTRIgine (LAMICTAL PO) Take 300 mg by mouth daily with supper.       Marland Kitchen oxyCODONE-acetaminophen (PERCOCET/ROXICET) 5-325 MG per tablet Take 1-2 tablets by mouth every 4 (four) hours as needed for severe pain (moderate - severe pain).  30 tablet  0  . Prenatal Vit-Fe Fumarate-FA (PRENATAL MULTIVITAMIN) TABS tablet Take 1 tablet by mouth daily at 12 noon.      . Evening Primrose Oil 500 MG CAPS Place 1 capsule vaginally at bedtime.      Marland Kitchen OVER THE COUNTER MEDICATION Take 1 capsule by mouth daily. Patient is taking Blue Cohosh.        Review of Systems  Constitutional: Negative.   HENT: Negative.   Eyes: Negative.   Respiratory: Negative.   Gastrointestinal: Positive for abdominal pain.       In a small spot on Lt side of incision  Genitourinary: Negative.   Musculoskeletal: Negative.  Skin: Negative.   Neurological: Negative.   Endo/Heme/Allergies: Negative.   Psychiatric/Behavioral: Negative.    Physical Exam   Blood pressure 115/77, pulse 81, temperature 98.4 F (36.9 C), temperature source Oral, resp. rate 18, last menstrual period 02/24/2013.  Physical Exam  Constitutional: She is oriented to person, place, and time. She appears well-developed and well-nourished.  HENT:  Head: Normocephalic and atraumatic.  Eyes: Pupils are equal, round, and reactive to light.  Neck: Normal range of motion. Neck supple.  Cardiovascular: Normal rate, regular rhythm and normal heart sounds.   Respiratory: Effort normal and breath sounds normal.  GI: Soft. Bowel sounds are normal.  Genitourinary:  Pelvic deferred  Musculoskeletal: Normal range of motion.  Neurological: She is alert and oriented to  person, place, and time.  Skin: Skin is warm and dry.  No signs of infection; steri strips were 100% soiled with dried serosanguinous drainage, 1 pin point size of dried blood noted 1 cm from Lt edge of incision - no active bleeding at this time. Steri strips removed, incision cleaned with H2O2 diluted with NS, dried with sterile gauze pad, new steri strips applied - pt tolerated procedure well.  Psychiatric: She has a normal mood and affect. Her behavior is normal. Judgment and thought content normal.    MAU Course  Procedures Wound care  Assessment and Plan  S/P Primary Cesarean delivery - 1 week ago Wound drainage  Discharge Home Wound Care instructions reviewed / keep incision clean & dry Percocet 1-2 every 4 hours prn pain Call to schedule 6 week PP visit  Call the office for increased drainage and/or pain, fever or other signs of infection  Laury Deep, Jerilynn Mages, MSN, CNM 12/13/2013, 3:09 PM

## 2013-12-16 NOTE — MAU Provider Note (Signed)
Reviewed and agree with note and plan. V.Makai Dumond, MD  

## 2014-09-06 ENCOUNTER — Encounter (HOSPITAL_COMMUNITY): Payer: Self-pay | Admitting: *Deleted

## 2016-05-07 ENCOUNTER — Inpatient Hospital Stay (HOSPITAL_COMMUNITY)
Admission: AD | Admit: 2016-05-07 | Discharge: 2016-05-07 | Disposition: A | Discharge status: Home / Self Care | Payer: 59 | Source: Ambulatory Visit | Attending: Obstetrics & Gynecology | Admitting: Obstetrics & Gynecology

## 2016-05-07 ENCOUNTER — Encounter (HOSPITAL_COMMUNITY): Payer: Self-pay | Admitting: *Deleted

## 2016-05-07 DIAGNOSIS — Z885 Allergy status to narcotic agent status: Secondary | ICD-10-CM | POA: Insufficient documentation

## 2016-05-07 DIAGNOSIS — O219 Vomiting of pregnancy, unspecified: Secondary | ICD-10-CM | POA: Diagnosis not present

## 2016-05-07 DIAGNOSIS — O210 Mild hyperemesis gravidarum: Secondary | ICD-10-CM | POA: Insufficient documentation

## 2016-05-07 DIAGNOSIS — A084 Viral intestinal infection, unspecified: Secondary | ICD-10-CM | POA: Insufficient documentation

## 2016-05-07 DIAGNOSIS — Z87891 Personal history of nicotine dependence: Secondary | ICD-10-CM | POA: Insufficient documentation

## 2016-05-07 DIAGNOSIS — Z3A16 16 weeks gestation of pregnancy: Secondary | ICD-10-CM | POA: Insufficient documentation

## 2016-05-07 LAB — COMPREHENSIVE METABOLIC PANEL
ALT: 18 U/L (ref 14–54)
AST: 16 U/L (ref 15–41)
Albumin: 3.7 g/dL (ref 3.5–5.0)
Alkaline Phosphatase: 54 U/L (ref 38–126)
Anion gap: 9 (ref 5–15)
BUN: 13 mg/dL (ref 6–20)
CHLORIDE: 104 mmol/L (ref 101–111)
CO2: 22 mmol/L (ref 22–32)
CREATININE: 0.51 mg/dL (ref 0.44–1.00)
Calcium: 8.8 mg/dL — ABNORMAL LOW (ref 8.9–10.3)
GFR calc Af Amer: >60 mL/min (ref >60)
GLUCOSE: 90 mg/dL (ref 65–99)
Potassium: 3.8 mmol/L (ref 3.5–5.1)
Sodium: 135 mmol/L (ref 135–145)
Total Bilirubin: 0.3 mg/dL (ref 0.3–1.2)
Total Protein: 7 g/dL (ref 6.5–8.1)

## 2016-05-07 LAB — URINALYSIS, ROUTINE W REFLEX MICROSCOPIC
BILIRUBIN URINE: NEGATIVE
Glucose, UA: NEGATIVE mg/dL
KETONES UR: 40 mg/dL — AB
Leukocytes, UA: NEGATIVE
Nitrite: NEGATIVE
PROTEIN: NEGATIVE mg/dL
Specific Gravity, Urine: 1.025 (ref 1.005–1.030)
pH: 5.5 (ref 5.0–8.0)

## 2016-05-07 LAB — CBC
HEMATOCRIT: 37 % (ref 36.0–46.0)
Hemoglobin: 12.7 g/dL (ref 12.0–15.0)
MCH: 30.3 pg (ref 26.0–34.0)
MCHC: 34.3 g/dL (ref 30.0–36.0)
MCV: 88.3 fL (ref 78.0–100.0)
PLATELETS: 276 10*3/uL (ref 150–400)
RBC: 4.19 MIL/uL (ref 3.87–5.11)
RDW: 13 % (ref 11.5–15.5)
WBC: 15.3 10*3/uL — AB (ref 4.0–10.5)

## 2016-05-07 LAB — URINE MICROSCOPIC-ADD ON

## 2016-05-07 MED ORDER — ONDANSETRON HCL 4 MG/2ML IJ SOLN
4.0000 mg | Freq: Once | INTRAMUSCULAR | Status: AC
  Administered 2016-05-07: 4 mg via INTRAVENOUS
  Filled 2016-05-07: qty 2

## 2016-05-07 MED ORDER — LACTATED RINGERS IV BOLUS (SEPSIS)
1000.0000 mL | Freq: Once | INTRAVENOUS | Status: AC
  Administered 2016-05-07: 1000 mL via INTRAVENOUS

## 2016-05-07 MED ORDER — ONDANSETRON 4 MG PO TBDP: 4.0000 mg | ORAL_TABLET | Freq: Four times a day (QID) | ORAL | Status: DC | PRN

## 2016-05-07 MED ORDER — FAMOTIDINE IN NACL 20-0.9 MG/50ML-% IV SOLN
20.0000 mg | Freq: Once | INTRAVENOUS | Status: AC
  Administered 2016-05-07: 20 mg via INTRAVENOUS
  Filled 2016-05-07: qty 50

## 2016-05-07 MED ORDER — PROMETHAZINE HCL 25 MG/ML IJ SOLN
25.0000 mg | Freq: Once | INTRAVENOUS | Status: AC
  Administered 2016-05-07: 25 mg via INTRAVENOUS
  Filled 2016-05-07: qty 1

## 2016-05-07 NOTE — Discharge Instructions (Signed)

## 2016-05-07 NOTE — MAU Provider Note (Signed)
Chief Complaint: Emesis During Pregnancy   First Provider Initiated Contact with Patient 05/07/16 1449      SUBJECTIVE HPI: Natasha Watson is a 35 y.o. G2P1001 at [redacted]w[redacted]d by LMP who presents to maternity admissions reporting sudden onset of nausea/vomiting at 3 am yesterday.  She reports she was on a work trip and woke up early for her flight feeling nauseous. As the morning progressed, she vomited several times and reports she has not kept down even liquids for 24 hours. She reports abdominal pain when vomiting but denies pain at present.  She denies sick contacts.  She has not tried any treatments for her symptoms.  She denies vaginal bleeding, vaginal itching/burning, urinary symptoms, h/a, dizziness, or fever/chills.     HPI  Past Medical History  Diagnosis Date  . Bulimia     HISTORY OF  . Attempted suicide ([place]) 10/2003  . Seizure disorder ([place]) 03/2004    DR. CARMEN DOHMEIER FOLLOWS HER FOR THIS.  . Rubella immune 2009  . Hx of varicella   . Polyhydramnios   . Postpartum care following cesarean delivery (1/30) 12/05/2013  . Seizures ([place])     Last seizure 2013   Past Surgical History  Procedure Laterality Date  . Wisdom tooth extraction  06/2004  . Cesarean section N/A 12/04/2013    Procedure: CESAREAN SECTION;  Surgeon: Marvene Staff, MD;  Location: [facility];  Service: Obstetrics;  Laterality: N/A;   Social History   Social History  . Marital Status: Married    Spouse Name: N/A  . Number of Children: N/A  . Years of Education: N/A   Occupational History  . Not on file.   Social History Main Topics  . Smoking status: Former Smoker    Quit date: 10/10/2001  . Smokeless tobacco: Never Used  . Alcohol Use: Yes     Comment: rarely  . Drug Use: No  . Sexual Activity:    Partners: Male    Birth Control/ Protection: None   Other Topics Concern  . Not on file   Social History Narrative   No current facility-administered medications on file prior to  encounter.   Current Outpatient Prescriptions on File Prior to Encounter  Medication Sig Dispense Refill  . Prenatal Vit-Fe Fumarate-FA (PRENATAL MULTIVITAMIN) TABS tablet Take 1 tablet by mouth daily at 12 noon.     Allergies  Allergen Reactions  . Darvocet [Propoxyphene N-Acetaminophen] Nausea And Vomiting    ROS:  Review of Systems  Constitutional: Negative for fever, chills and fatigue.  Respiratory: Negative for shortness of breath.   Cardiovascular: Negative for chest pain.  Gastrointestinal: Positive for nausea and vomiting. Negative for abdominal pain.  Genitourinary: Negative for dysuria, flank pain, vaginal bleeding, vaginal discharge, difficulty urinating, vaginal pain and pelvic pain.  Neurological: Negative for dizziness and headaches.  Psychiatric/Behavioral: Negative.      I have reviewed patient's Past Medical Hx, Surgical Hx, Family Hx, Social Hx, medications and allergies.   Physical Exam   Patient Vitals for the past 24 hrs:  BP Temp Temp src Pulse Resp SpO2 Height Weight  05/07/16 1340 113/64 mmHg 98 F (36.7 C) Oral 91 18 99 % 5' 0.63" (1.54 m) 138 lb 12 oz (62.937 kg)   Constitutional: Well-developed, well-nourished female in no acute distress.  Cardiovascular: normal rate Respiratory: normal effort GI: Abd soft, non-tender. Pos BS x 4 MS: Extremities nontender, no edema, normal ROM Neurologic: Alert and oriented x 4.  GU: Neg CVAT.  PELVIC  EXAM: Deferred  FHT 155 by doppler  LAB RESULTS Results for orders placed or performed during the hospital encounter of 05/07/16 (from the past 24 hour(s))  Urinalysis, Routine w reflex microscopic (not at [facility])     Status: Abnormal   Collection Time: 05/07/16  1:45 PM  Result Value Ref Range   Color, Urine YELLOW YELLOW   APPearance CLEAR CLEAR   Specific Gravity, Urine 1.025 1.005 - 1.030   pH 5.5 5.0 - 8.0   Glucose, UA NEGATIVE NEGATIVE mg/dL   Hgb urine dipstick TRACE (A) NEGATIVE   Bilirubin Urine  NEGATIVE NEGATIVE   Ketones, ur 40 (A) NEGATIVE mg/dL   Protein, ur NEGATIVE NEGATIVE mg/dL   Nitrite NEGATIVE NEGATIVE   Leukocytes, UA NEGATIVE NEGATIVE  Urine microscopic-add on     Status: Abnormal   Collection Time: 05/07/16  1:45 PM  Result Value Ref Range   Squamous Epithelial / LPF 0-5 (A) NONE SEEN   WBC, UA 0-5 0 - 5 WBC/hpf   RBC / HPF 0-5 0 - 5 RBC/hpf   Bacteria, UA FEW (A) NONE SEEN   Urine-Other MUCOUS PRESENT   CBC     Status: Abnormal   Collection Time: 05/07/16  2:39 PM  Result Value Ref Range   WBC 15.3 (H) 4.0 - 10.5 K/uL   RBC 4.19 3.87 - 5.11 MIL/uL   Hemoglobin 12.7 12.0 - 15.0 g/dL   HCT 37.0 36.0 - 46.0 %   MCV 88.3 78.0 - 100.0 fL   MCH 30.3 26.0 - 34.0 pg   MCHC 34.3 30.0 - 36.0 g/dL   RDW 13.0 11.5 - 15.5 %   Platelets 276 150 - 400 K/uL  Comprehensive metabolic panel     Status: Abnormal   Collection Time: 05/07/16  2:39 PM  Result Value Ref Range   Sodium 135 135 - 145 mmol/L   Potassium 3.8 3.5 - 5.1 mmol/L   Chloride 104 101 - 111 mmol/L   CO2 22 22 - 32 mmol/L   Glucose, Bld 90 65 - 99 mg/dL   BUN 13 6 - 20 mg/dL   Creatinine, Ser 0.51 0.44 - 1.00 mg/dL   Calcium 8.8 (L) 8.9 - 10.3 mg/dL   Total Protein 7.0 6.5 - 8.1 g/dL   Albumin 3.7 3.5 - 5.0 g/dL   AST 16 15 - 41 U/L   ALT 18 14 - 54 U/L   Alkaline Phosphatase 54 38 - 126 U/L   Total Bilirubin 0.3 0.3 - 1.2 mg/dL   GFR calc non Af Amer >60 >60 mL/min   GFR calc Af Amer >60 >60 mL/min   Anion gap 9 5 - 15       IMAGING No results found.  MAU Management/MDM: Ordered labs and reviewed results.  Symptoms c/w viral gastroenteritis.  Pt with hx of hyperemesis of pregnancy that only recently resolved.  Dehydration noted on U/A treated with D5/LR with Phenergan 25 mg added and second bag of LR 1000 ml and Pepcid 20 mg IV given.  Pt continued to feel nauseous and vomited in MAU.  Consult Dr Benjie Karvonen.  Zofran 4 mg IV given.  Pt tolerated ice chips, no more vomiting while in MAU.  D/C home  with Rx for Zofran.  Pt prefers ODT medication so 4 mg ODT Q 6-8 hours.  Pt to call office Wednesday if symptoms persist/return to MAU for emergencies.  Pt stable at time of discharge.  ASSESSMENT 1. Viral gastroenteritis   2. Nausea and  vomiting during pregnancy     PLAN Discharge home    Medication List    TAKE these medications        FOLIC ACID PO  Take 1 tablet by mouth at bedtime.     lamoTRIgine 100 MG tablet  Commonly known as:  LAMICTAL  Take 300 mg by mouth at bedtime.     ondansetron 4 MG disintegrating tablet  Commonly known as:  ZOFRAN ODT  Take 1 tablet (4 mg total) by mouth every 6 (six) hours as needed for nausea.     prenatal multivitamin Tabs tablet  Take 1 tablet by mouth daily at 12 noon.     VITAMIN D PO  Take 1 tablet by mouth at bedtime.       Follow-up Information    Follow up with Marvene Staff, MD.   Specialty:  Obstetrics and Gynecology   Why:  Call on Wednesday if symptoms persist, Return to MAU as needed for emergencies   Contact information:   [place] [facility] [id number] [phone]       Quynh Basso Left[place] Certified Nurse-Midwife 05/07/2016  6:18 PM

## 2016-05-07 NOTE — MAU Note (Signed)
Pt has been out of town, began vomiting on 7/2 @ 0300.  Has been vomiting ever since, can't keep ice chips of any liquids down.  Pt on seizure med, hasn't been able to hold it down.  Denies diarrhea.  Has abd discomfort while vomiting.  Denies bleeding.

## 2016-09-01 ENCOUNTER — Encounter (HOSPITAL_COMMUNITY): Payer: Self-pay | Admitting: *Deleted

## 2016-09-01 ENCOUNTER — Inpatient Hospital Stay (HOSPITAL_COMMUNITY)
Admission: AD | Admit: 2016-09-01 | Discharge: 2016-09-02 | Disposition: A | Discharge status: Home / Self Care | Payer: 59 | Source: Ambulatory Visit | Attending: Obstetrics and Gynecology | Admitting: Obstetrics and Gynecology

## 2016-09-01 DIAGNOSIS — K529 Noninfective gastroenteritis and colitis, unspecified: Secondary | ICD-10-CM

## 2016-09-01 DIAGNOSIS — A084 Viral intestinal infection, unspecified: Secondary | ICD-10-CM | POA: Insufficient documentation

## 2016-09-01 DIAGNOSIS — O99283 Endocrine, nutritional and metabolic diseases complicating pregnancy, third trimester: Secondary | ICD-10-CM | POA: Diagnosis present

## 2016-09-01 DIAGNOSIS — O26893 Other specified pregnancy related conditions, third trimester: Secondary | ICD-10-CM | POA: Insufficient documentation

## 2016-09-01 DIAGNOSIS — M549 Dorsalgia, unspecified: Secondary | ICD-10-CM | POA: Insufficient documentation

## 2016-09-01 DIAGNOSIS — Z87891 Personal history of nicotine dependence: Secondary | ICD-10-CM | POA: Insufficient documentation

## 2016-09-01 DIAGNOSIS — O99353 Diseases of the nervous system complicating pregnancy, third trimester: Secondary | ICD-10-CM | POA: Insufficient documentation

## 2016-09-01 DIAGNOSIS — Z3A33 33 weeks gestation of pregnancy: Secondary | ICD-10-CM | POA: Insufficient documentation

## 2016-09-01 DIAGNOSIS — E860 Dehydration: Secondary | ICD-10-CM | POA: Insufficient documentation

## 2016-09-01 LAB — URINALYSIS, ROUTINE W REFLEX MICROSCOPIC
Bilirubin Urine: NEGATIVE
GLUCOSE, UA: NEGATIVE mg/dL
HGB URINE DIPSTICK: NEGATIVE
KETONES UR: 40 mg/dL — AB
LEUKOCYTES UA: NEGATIVE
Nitrite: NEGATIVE
PROTEIN: NEGATIVE mg/dL
Specific Gravity, Urine: 1.02 (ref 1.005–1.030)
pH: 7 (ref 5.0–8.0)

## 2016-09-01 LAB — FETAL FIBRONECTIN: FETAL FIBRONECTIN: NEGATIVE

## 2016-09-01 MED ORDER — M.V.I. ADULT IV INJ
Freq: Once | INTRAVENOUS | Status: AC
  Administered 2016-09-01: 23:00:00 via INTRAVENOUS
  Filled 2016-09-01: qty 10

## 2016-09-01 MED ORDER — ONDANSETRON HCL 4 MG/2ML IJ SOLN
4.0000 mg | Freq: Once | INTRAMUSCULAR | Status: AC
  Administered 2016-09-01: 4 mg via INTRAVENOUS
  Filled 2016-09-01: qty 2

## 2016-09-01 MED ORDER — ONDANSETRON 4 MG PO TBDP: 4.0000 mg | ORAL_TABLET | Freq: Three times a day (TID) | ORAL | 0 refills | Status: DC | PRN

## 2016-09-01 MED ORDER — LOPERAMIDE HCL 2 MG PO CAPS
4.0000 mg | ORAL_CAPSULE | Freq: Once | ORAL | Status: AC
  Administered 2016-09-01: 4 mg via ORAL
  Filled 2016-09-01: qty 2

## 2016-09-01 MED ORDER — LACTATED RINGERS IV BOLUS (SEPSIS)
2000.0000 mL | Freq: Once | INTRAVENOUS | Status: AC
  Administered 2016-09-01: 2000 mL via INTRAVENOUS

## 2016-09-01 NOTE — MAU Provider Note (Signed)
History     Chief Complaint  Patient presents with  . Contractions  . Back Pain  . Emesis During Pregnancy   HPI: 35yo  G2P1001 MWF @ 33 2/[redacted] weeks gestation presents with c/o back pain and n/v/d ( last episode @ 5:30 pm)starting last night. Child has been sick. (+) ctx. Feels drink plenty of fluid. Denies fever  OB History    Gravida Para Term Preterm AB Living   2 1 1     1    SAB TAB Ectopic Multiple Live Births           1      Past Medical History:  Diagnosis Date  . Attempted suicide 10/2003  . Bulimia    HISTORY OF  . Hx of varicella   . Polyhydramnios   . Postpartum care following cesarean delivery (1/30) 12/05/2013  . Rubella immune 2009  . Seizure disorder ([place]) 03/2004   DR. CARMEN DOHMEIER FOLLOWS HER FOR THIS.  . Seizures ([place])    Last seizure 2013    Past Surgical History:  Procedure Laterality Date  . CESAREAN SECTION N/A 12/04/2013   Procedure: CESAREAN SECTION;  Surgeon: Marvene Staff, MD;  Location: [facility];  Service: Obstetrics;  Laterality: N/A;  . WISDOM TOOTH EXTRACTION  06/2004    Family History  Problem Relation Age of Onset  . Breast cancer Mother 30  . Breast cancer Paternal Aunt   . Cancer Paternal Aunt     OVARIAN OR UTERINE  . Breast cancer Maternal Grandmother   . Heart disease Maternal Grandfather   . Hyperlipidemia Maternal Grandfather     Social History  Substance Use Topics  . Smoking status: Former Smoker    Quit date: 10/10/2001  . Smokeless tobacco: Never Used  . Alcohol use Yes     Comment: rarely    Allergies:  Allergies  Allergen Reactions  . Darvocet [Propoxyphene N-Acetaminophen] Nausea And Vomiting    Prescriptions Prior to Admission  Medication Sig Dispense Refill Last Dose  . Cholecalciferol (VITAMIN D PO) Take 1 tablet by mouth at bedtime.   Past Week at Unknown time  . FOLIC ACID PO Take 1 tablet by mouth at bedtime.   Past Week at Unknown time  . lamoTRIgine (LAMICTAL) 100 MG tablet Take 300 mg by  mouth at bedtime.   05/05/2016  . ondansetron (ZOFRAN ODT) 4 MG disintegrating tablet Take 1 tablet (4 mg total) by mouth every 6 (six) hours as needed for nausea. 20 tablet 0   . Prenatal Vit-Fe Fumarate-FA (PRENATAL MULTIVITAMIN) TABS tablet Take 1 tablet by mouth daily at 12 noon.   Past Week at Unknown time     Physical Exam   Blood pressure 117/73, pulse 98, temperature 98 F (36.7 C), resp. rate 20, height 5' (1.524 m), weight 70.4 kg (155 lb 3.2 oz).  General appearance: alert, cooperative and mild distress Lungs: clear to auscultation bilaterally Heart: regular rate and rhythm, S1, S2 normal, no murmur, click, rub or gallop Abdomen: gravid soft nontender Pelvic: adnexae not palpable, cervix normal in appearance, external genitalia normal, vagina normal without discharge and gravid, cervix closed/long/-3. Extremities: no edema, redness or tenderness in the calves or thighs Skin: Skin color, texture, turgor normal. No rashes or lesions    FFN done  Urinalysis (+) 40 ketone Tracing: baseline 140 (+) accels  160 irreg ctx ED Course  IMP:  Viral gastroenteritis PMC 2nd to dehydration IUP @ 33 2/7 weeks P) IVF ( 2L). Imodium.  Zofran IV. MVI  IV ( 2nd dose). MDM  Addendum: FFN neg fet better. Spaced ctx with FFN  Neg. D/c home Keep OB appt Seriah Brotzman A, MD 9:25 PM 09/01/2016

## 2016-09-01 NOTE — MAU Note (Signed)
Contractions since last night off and on. Today about 1500 started having nausea and lower back pain and R flank pain. Denies LOF or bleeding. Normal BM today.

## 2016-09-02 NOTE — Discharge Instructions (Signed)
History of Present Illness   Patient Identification Natasha Watson is a 35 y.o. female.  Patient information was obtained from {info source:60032}. History/Exam limitations: {limitations:60112::"none"}. Patient presented to the Emergency Department {arrival:60075}  Chief Complaint  Contractions; Back Pain; and Emesis During Pregnancy   Patient presents for evaluation of {gastroenteritis symptoms:623}. Onset of symptoms was {onset:60104}, and has been {clinical course - history:17::"unchanged"} since that time. Other family members affected - {companion:5061::"patient"}. There is no prior history of gastrointestinal disease. No known risk factors for parasitic or bacterial infection.   Past Medical History:  Diagnosis Date   Attempted suicide 10/2003   Bulimia    HISTORY OF   Hx of varicella    Polyhydramnios    Postpartum care following cesarean delivery (1/30) 12/05/2013   Rubella immune 2009   Seizure disorder ([place]) 03/2004   DR. CARMEN DOHMEIER FOLLOWS HER FOR THIS.   Seizures ([place])    Last seizure 2013   Family History  Problem Relation Age of Onset   Breast cancer Mother 29   Breast cancer Paternal Aunt    Cancer Paternal Aunt     OVARIAN OR UTERINE   Breast cancer Maternal Grandmother    Heart disease Maternal Grandfather    Hyperlipidemia Maternal Grandfather    Scheduled Meds: Continuous Infusions: PRN Meds:  Allergies  Allergen Reactions   Darvocet [Propoxyphene N-Acetaminophen] Nausea And Vomiting   Social History   Social History   Marital status: Married    Spouse name: N/A   Number of children: N/A   Years of education: N/A   Occupational History   Not on file.   Social History Main Topics   Smoking status: Former Smoker    Quit date: 10/10/2001   Smokeless tobacco: Never Used   Alcohol use Yes     Comment: rarely   Drug use: No   Sexual activity: Yes    Partners: Male    Birth control/ protection: None    Other Topics Concern   Not on file   Social History Narrative   No narrative on file   Review of Systems {ROS - complete:30496}   Physical Exam   BP 117/73 (BP Location: Right Arm)    Pulse 98    Temp 98.5 F (36.9 C) (Oral)    Resp 20    Ht 5' (1.524 m)    Wt 155 lb 3.2 oz (70.4 kg)    BMI 30.31 kg/m  {Exam, Complete:17964}  ED Course   Studies: {Studies:60337}  Records Reviewed: {Reviewed:60251}  Treatments: {ED GI Tx list:18155}  Consultations: {Consultations:60250}  Disposition: {ED Disposition:60811}Preterm Labor Information Preterm labor is when labor starts at less than 37 weeks of pregnancy. The normal length of a pregnancy is 39 to 41 weeks. CAUSES Often, there is no identifiable underlying cause as to why a woman goes into preterm labor. One of the most common known causes of preterm labor is infection. Infections of the uterus, cervix, vagina, amniotic sac, bladder, kidney, or even the lungs (pneumonia) can cause labor to start. Other suspected causes of preterm labor include:   Urogenital infections, such as yeast infections and bacterial vaginosis.   Uterine abnormalities (uterine shape, uterine septum, fibroids, or bleeding from the placenta).   A cervix that has been operated on (it may fail to stay closed).   Malformations in the fetus.   Multiple gestations (twins, triplets, and so on).   Breakage of the amniotic sac.  RISK FACTORS  Having a previous history of preterm  labor.   Having premature rupture of membranes (PROM).   Having a placenta that covers the opening of the cervix (placenta previa).   Having a placenta that separates from the uterus (placental abruption).   Having a cervix that is too weak to hold the fetus in the uterus (incompetent cervix).   Having too much fluid in the amniotic sac (polyhydramnios).   Taking illegal drugs or smoking while pregnant.   Not gaining enough weight while pregnant.   Being  younger than 58 and older than 35 years old.   Having a low socioeconomic status.   Being African American. SYMPTOMS Signs and symptoms of preterm labor include:   Menstrual-like cramps, abdominal pain, or back pain.  Uterine contractions that are regular, as frequent as six in an hour, regardless of their intensity (may be mild or painful).  Contractions that start on the top of the uterus and spread down to the lower abdomen and back.   A sense of increased pelvic pressure.   A watery or bloody mucus discharge that comes from the vagina.  TREATMENT Depending on the length of the pregnancy and other circumstances, your health care provider may suggest bed rest. If necessary, there are medicines that can be given to stop contractions and to mature the fetal lungs. If labor happens before 34 weeks of pregnancy, a prolonged hospital stay may be recommended. Treatment depends on the condition of both you and the fetus.  WHAT SHOULD YOU DO IF YOU THINK YOU ARE IN PRETERM LABOR? Call your health care provider right away. You will need to go to the hospital to get checked immediately. HOW CAN YOU PREVENT PRETERM LABOR IN FUTURE PREGNANCIES? You should:   Stop smoking if you smoke.  Maintain healthy weight gain and avoid chemicals and drugs that are not necessary.  Be watchful for any type of infection.  Inform your health care provider if you have a known history of preterm labor.   This information is not intended to replace advice given to you by your health care provider. Make sure you discuss any questions you have with your health care provider.   Document Released: 01/12/2004 Document Revised: 06/24/2013 Document Reviewed: 11/24/2012 Elsevier Interactive Patient Education [employer].

## 2016-09-02 NOTE — Progress Notes (Signed)
Pt's husband came out and stated pt is ready to go home.  Informed pt is having occasional uc, will like to monitor and make sure uc subsides  before pt is DC

## 2016-10-12 ENCOUNTER — Encounter (HOSPITAL_COMMUNITY)
Admission: AD | Disposition: A | Discharge status: Home / Self Care | Payer: Self-pay | Source: Ambulatory Visit | Attending: Obstetrics and Gynecology

## 2016-10-12 ENCOUNTER — Inpatient Hospital Stay (HOSPITAL_COMMUNITY)
Admission: AD | Admit: 2016-10-12 | Discharge: 2016-10-14 | DRG: 765 | Disposition: A | Discharge status: Home / Self Care | Payer: 59 | Source: Ambulatory Visit | Attending: Obstetrics and Gynecology | Admitting: Obstetrics and Gynecology

## 2016-10-12 ENCOUNTER — Encounter (HOSPITAL_COMMUNITY): Payer: Self-pay | Admitting: *Deleted

## 2016-10-12 ENCOUNTER — Inpatient Hospital Stay (HOSPITAL_COMMUNITY): Payer: 59 | Admitting: Anesthesiology

## 2016-10-12 DIAGNOSIS — Z6791 Unspecified blood type, Rh negative: Secondary | ICD-10-CM | POA: Diagnosis not present

## 2016-10-12 DIAGNOSIS — G40909 Epilepsy, unspecified, not intractable, without status epilepticus: Secondary | ICD-10-CM | POA: Diagnosis present

## 2016-10-12 DIAGNOSIS — O26893 Other specified pregnancy related conditions, third trimester: Secondary | ICD-10-CM | POA: Diagnosis present

## 2016-10-12 DIAGNOSIS — Z87891 Personal history of nicotine dependence: Secondary | ICD-10-CM | POA: Diagnosis not present

## 2016-10-12 DIAGNOSIS — O403XX0 Polyhydramnios, third trimester, not applicable or unspecified: Principal | ICD-10-CM | POA: Diagnosis present

## 2016-10-12 DIAGNOSIS — O34211 Maternal care for low transverse scar from previous cesarean delivery: Secondary | ICD-10-CM | POA: Diagnosis present

## 2016-10-12 DIAGNOSIS — Z3A39 39 weeks gestation of pregnancy: Secondary | ICD-10-CM | POA: Diagnosis not present

## 2016-10-12 DIAGNOSIS — O99354 Diseases of the nervous system complicating childbirth: Secondary | ICD-10-CM | POA: Diagnosis present

## 2016-10-12 LAB — CBC
HCT: 34.1 % — ABNORMAL LOW (ref 36.0–46.0)
HEMOGLOBIN: 11.8 g/dL — AB (ref 12.0–15.0)
MCH: 31.1 pg (ref 26.0–34.0)
MCHC: 34.6 g/dL (ref 30.0–36.0)
MCV: 90 fL (ref 78.0–100.0)
Platelets: 207 10*3/uL (ref 150–400)
RBC: 3.79 MIL/uL — AB (ref 3.87–5.11)
RDW: 12.9 % (ref 11.5–15.5)
WBC: 9.7 10*3/uL (ref 4.0–10.5)

## 2016-10-12 LAB — TYPE AND SCREEN
ABO/RH(D): A NEG
Antibody Screen: NEGATIVE

## 2016-10-12 SURGERY — Surgical Case
Anesthesia: Epidural | Site: Abdomen

## 2016-10-12 MED ORDER — BUPIVACAINE HCL (PF) 0.25 % IJ SOLN
INTRAMUSCULAR | Status: AC
  Filled 2016-10-12: qty 30

## 2016-10-12 MED ORDER — SODIUM BICARBONATE 8.4 % IV SOLN
INTRAVENOUS | Status: AC
  Filled 2016-10-12: qty 50

## 2016-10-12 MED ORDER — LACTATED RINGERS IV SOLN: 500.0000 mL | INTRAVENOUS | Status: DC | PRN

## 2016-10-12 MED ORDER — CEFAZOLIN SODIUM-DEXTROSE 2-3 GM-% IV SOLR
INTRAVENOUS | Status: DC | PRN
  Administered 2016-10-12: 2 g via INTRAVENOUS

## 2016-10-12 MED ORDER — MEPERIDINE HCL 25 MG/ML IJ SOLN
INTRAMUSCULAR | Status: AC
  Filled 2016-10-12: qty 1

## 2016-10-12 MED ORDER — LACTATED RINGERS IV SOLN: INTRAVENOUS | Status: DC

## 2016-10-12 MED ORDER — OXYTOCIN BOLUS FROM INFUSION: 500.0000 mL | Freq: Once | INTRAVENOUS | Status: DC | PRN

## 2016-10-12 MED ORDER — METOCLOPRAMIDE HCL 5 MG/ML IJ SOLN: 10.0000 mg | Freq: Once | INTRAMUSCULAR | Status: DC | PRN

## 2016-10-12 MED ORDER — LACTATED RINGERS IV SOLN
INTRAVENOUS | Status: DC
  Administered 2016-10-12 (×2): via INTRAVENOUS

## 2016-10-12 MED ORDER — ONDANSETRON HCL 4 MG/2ML IJ SOLN
4.0000 mg | Freq: Four times a day (QID) | INTRAMUSCULAR | Status: DC | PRN
  Administered 2016-10-12: 4 mg via INTRAVENOUS
  Filled 2016-10-12: qty 2

## 2016-10-12 MED ORDER — SCOPOLAMINE 1 MG/3DAYS TD PT72
MEDICATED_PATCH | TRANSDERMAL | Status: AC
  Filled 2016-10-12: qty 1

## 2016-10-12 MED ORDER — MORPHINE SULFATE (PF) 0.5 MG/ML IJ SOLN
INTRAMUSCULAR | Status: AC
  Filled 2016-10-12: qty 10

## 2016-10-12 MED ORDER — OXYTOCIN 40 UNITS IN LACTATED RINGERS INFUSION - SIMPLE MED
1.0000 m[IU]/min | INTRAVENOUS | Status: DC
  Administered 2016-10-12: 2 m[IU]/min via INTRAVENOUS

## 2016-10-12 MED ORDER — BUTORPHANOL TARTRATE 2 MG/ML IJ SOLN
2.0000 mg | INTRAMUSCULAR | Status: DC | PRN
  Administered 2016-10-12: 2 mg via INTRAVENOUS
  Filled 2016-10-12: qty 2

## 2016-10-12 MED ORDER — PHENYLEPHRINE HCL 10 MG/ML IJ SOLN
INTRAMUSCULAR | Status: DC | PRN
  Administered 2016-10-12: 80 ug via INTRAVENOUS
  Administered 2016-10-12: 40 ug via INTRAVENOUS
  Administered 2016-10-12 (×2): 80 ug via INTRAVENOUS
  Administered 2016-10-12: 40 ug via INTRAVENOUS
  Administered 2016-10-12: 80 ug via INTRAVENOUS

## 2016-10-12 MED ORDER — OXYTOCIN 40 UNITS IN LACTATED RINGERS INFUSION - SIMPLE MED
2.5000 [IU]/h | Freq: Once | INTRAVENOUS | Status: DC | PRN
  Filled 2016-10-12: qty 1000

## 2016-10-12 MED ORDER — DEXAMETHASONE SODIUM PHOSPHATE 10 MG/ML IJ SOLN
INTRAMUSCULAR | Status: DC | PRN
  Administered 2016-10-12: 4 mg via INTRAVENOUS

## 2016-10-12 MED ORDER — FENTANYL 2.5 MCG/ML BUPIVACAINE 1/10 % EPIDURAL INFUSION (WH - ANES)
14.0000 mL/h | INTRAMUSCULAR | Status: DC | PRN
  Administered 2016-10-12: 14 mL/h via EPIDURAL
  Filled 2016-10-12: qty 100

## 2016-10-12 MED ORDER — FENTANYL CITRATE (PF) 100 MCG/2ML IJ SOLN: 25.0000 ug | INTRAMUSCULAR | Status: DC | PRN

## 2016-10-12 MED ORDER — EPHEDRINE 5 MG/ML INJ: 10.0000 mg | INTRAVENOUS | Status: DC | PRN

## 2016-10-12 MED ORDER — PHENYLEPHRINE HCL 10 MG/ML IJ SOLN: INTRAMUSCULAR | Status: DC | PRN

## 2016-10-12 MED ORDER — DEXAMETHASONE SODIUM PHOSPHATE 4 MG/ML IJ SOLN
INTRAMUSCULAR | Status: AC
  Filled 2016-10-12: qty 1

## 2016-10-12 MED ORDER — PHENYLEPHRINE 40 MCG/ML (10ML) SYRINGE FOR IV PUSH (FOR BLOOD PRESSURE SUPPORT): 80.0000 ug | PREFILLED_SYRINGE | INTRAVENOUS | Status: DC | PRN

## 2016-10-12 MED ORDER — BUPIVACAINE HCL (PF) 0.25 % IJ SOLN
INTRAMUSCULAR | Status: DC | PRN
  Administered 2016-10-12: 30 mL

## 2016-10-12 MED ORDER — OXYTOCIN 10 UNIT/ML IJ SOLN
INTRAMUSCULAR | Status: AC
  Filled 2016-10-12: qty 4

## 2016-10-12 MED ORDER — CEFAZOLIN SODIUM-DEXTROSE 2-4 GM/100ML-% IV SOLN
INTRAVENOUS | Status: AC
  Filled 2016-10-12: qty 100

## 2016-10-12 MED ORDER — LACTATED RINGERS IV SOLN: 500.0000 mL | Freq: Once | INTRAVENOUS | Status: DC

## 2016-10-12 MED ORDER — PHENYLEPHRINE 40 MCG/ML (10ML) SYRINGE FOR IV PUSH (FOR BLOOD PRESSURE SUPPORT)
PREFILLED_SYRINGE | INTRAVENOUS | Status: AC
  Filled 2016-10-12: qty 20

## 2016-10-12 MED ORDER — SOD CITRATE-CITRIC ACID 500-334 MG/5ML PO SOLN
30.0000 mL | ORAL | Status: DC | PRN
  Administered 2016-10-12: 30 mL via ORAL
  Filled 2016-10-12: qty 15

## 2016-10-12 MED ORDER — MEPERIDINE HCL 25 MG/ML IJ SOLN
INTRAMUSCULAR | Status: DC | PRN
  Administered 2016-10-12: 25 mg via INTRAVENOUS

## 2016-10-12 MED ORDER — LIDOCAINE HCL (PF) 1 % IJ SOLN
INTRAMUSCULAR | Status: DC | PRN
  Administered 2016-10-12 (×2): 5 mL

## 2016-10-12 MED ORDER — OXYTOCIN 10 UNIT/ML IJ SOLN
INTRAVENOUS | Status: DC | PRN
  Administered 2016-10-12: 40 [IU] via INTRAVENOUS

## 2016-10-12 MED ORDER — ONDANSETRON HCL 4 MG/2ML IJ SOLN
INTRAMUSCULAR | Status: DC | PRN
  Administered 2016-10-12: 4 mg via INTRAVENOUS

## 2016-10-12 MED ORDER — DIPHENHYDRAMINE HCL 50 MG/ML IJ SOLN: 12.5000 mg | INTRAMUSCULAR | Status: DC | PRN

## 2016-10-12 MED ORDER — ONDANSETRON HCL 4 MG/2ML IJ SOLN
INTRAMUSCULAR | Status: AC
  Filled 2016-10-12: qty 4

## 2016-10-12 MED ORDER — BUPIVACAINE-EPINEPHRINE (PF) 0.25% -1:200000 IJ SOLN
INTRAMUSCULAR | Status: AC
  Filled 2016-10-12: qty 30

## 2016-10-12 MED ORDER — MORPHINE SULFATE (PF) 0.5 MG/ML IJ SOLN
INTRAMUSCULAR | Status: DC | PRN
  Administered 2016-10-12: 4 mg via EPIDURAL

## 2016-10-12 MED ORDER — TERBUTALINE SULFATE 1 MG/ML IJ SOLN
0.2500 mg | Freq: Once | INTRAMUSCULAR | Status: AC | PRN
  Administered 2016-10-12: 0.25 mg via SUBCUTANEOUS
  Filled 2016-10-12: qty 1

## 2016-10-12 MED ORDER — PHENYLEPHRINE 40 MCG/ML (10ML) SYRINGE FOR IV PUSH (FOR BLOOD PRESSURE SUPPORT)
80.0000 ug | PREFILLED_SYRINGE | INTRAVENOUS | Status: DC | PRN
  Filled 2016-10-12: qty 10

## 2016-10-12 MED ORDER — MEPERIDINE HCL 25 MG/ML IJ SOLN
6.2500 mg | INTRAMUSCULAR | Status: DC | PRN
Start: 2016-10-12 — End: 2016-10-13

## 2016-10-12 MED ORDER — LIDOCAINE HCL (PF) 1 % IJ SOLN: 30.0000 mL | INTRAMUSCULAR | Status: DC | PRN

## 2016-10-12 MED ORDER — SODIUM BICARBONATE 8.4 % IV SOLN
INTRAVENOUS | Status: DC | PRN
  Administered 2016-10-12 (×2): 5 mL via EPIDURAL

## 2016-10-12 SURGICAL SUPPLY — 50 items
APL SKNCLS STERI-STRIP NONHPOA (GAUZE/BANDAGES/DRESSINGS) ×1
BARRIER ADHS 3X4 INTERCEED (GAUZE/BANDAGES/DRESSINGS) ×3 IMPLANT
BENZOIN TINCTURE PRP APPL 2/3 (GAUZE/BANDAGES/DRESSINGS) ×2 IMPLANT
BRR ADH 4X3 ABS CNTRL BYND (GAUZE/BANDAGES/DRESSINGS) ×1
CHLORAPREP W/TINT 26ML (MISCELLANEOUS) ×3 IMPLANT
CLAMP CORD UMBIL (MISCELLANEOUS) IMPLANT
CLOSURE WOUND 1/2 X4 (GAUZE/BANDAGES/DRESSINGS) ×1
CLOTH BEACON ORANGE TIMEOUT ST (SAFETY) ×3 IMPLANT
CONTAINER PREFILL 10% NBF 15ML (MISCELLANEOUS) IMPLANT
COVER LIGHT HANDLE  1/PK (MISCELLANEOUS) ×2
COVER LIGHT HANDLE 1/PK (MISCELLANEOUS) IMPLANT
DRAPE C SECTION CLR SCREEN (DRAPES) ×3 IMPLANT
DRSG OPSITE POSTOP 4X10 (GAUZE/BANDAGES/DRESSINGS) ×3 IMPLANT
ELECT REM PT RETURN 9FT ADLT (ELECTROSURGICAL) ×3
ELECTRODE REM PT RTRN 9FT ADLT (ELECTROSURGICAL) ×1 IMPLANT
EXTRACTOR VACUUM M CUP 4 TUBE (SUCTIONS) IMPLANT
EXTRACTOR VACUUM M CUP 4' TUBE (SUCTIONS)
GLOVE BIOGEL PI IND STRL 7.0 (GLOVE) ×2 IMPLANT
GLOVE BIOGEL PI INDICATOR 7.0 (GLOVE) ×4
GLOVE ECLIPSE 6.5 STRL STRAW (GLOVE) ×3 IMPLANT
GLOVE SURG SS PI 7.0 STRL IVOR (GLOVE) ×6 IMPLANT
GOWN STRL REUS W/TWL LRG LVL3 (GOWN DISPOSABLE) ×6 IMPLANT
KIT ABG SYR 3ML LUER SLIP (SYRINGE) IMPLANT
NDL HYPO 25X5/8 SAFETYGLIDE (NEEDLE) IMPLANT
NEEDLE HYPO 22GX1.5 SAFETY (NEEDLE) ×3 IMPLANT
NEEDLE HYPO 25X5/8 SAFETYGLIDE (NEEDLE) IMPLANT
NS IRRIG 1000ML POUR BTL (IV SOLUTION) ×3 IMPLANT
PACK C SECTION WH (CUSTOM PROCEDURE TRAY) ×3 IMPLANT
PAD OB MATERNITY 4.3X12.25 (PERSONAL CARE ITEMS) ×3 IMPLANT
RTRCTR C-SECT PINK 25CM LRG (MISCELLANEOUS) IMPLANT
STRIP CLOSURE SKIN 1/2X4 (GAUZE/BANDAGES/DRESSINGS) ×1 IMPLANT
SUT CHROMIC GUT AB #0 18 (SUTURE) IMPLANT
SUT MNCRL 0 VIOLET CTX 36 (SUTURE) ×3 IMPLANT
SUT MON AB 2-0 SH 27 (SUTURE)
SUT MON AB 2-0 SH27 (SUTURE) IMPLANT
SUT MON AB 3-0 SH 27 (SUTURE)
SUT MON AB 3-0 SH27 (SUTURE) IMPLANT
SUT MON AB 4-0 PS1 27 (SUTURE) IMPLANT
SUT MONOCRYL 0 CTX 36 (SUTURE) ×6
SUT PLAIN 2 0 (SUTURE)
SUT PLAIN 2 0 XLH (SUTURE) IMPLANT
SUT PLAIN ABS 2-0 CT1 27XMFL (SUTURE) IMPLANT
SUT VIC AB 0 CT1 36 (SUTURE) ×6 IMPLANT
SUT VIC AB 2-0 CT1 27 (SUTURE) ×3
SUT VIC AB 2-0 CT1 TAPERPNT 27 (SUTURE) ×1 IMPLANT
SUT VIC AB 4-0 KS 27 (SUTURE) ×2 IMPLANT
SUT VIC AB 4-0 PS2 27 (SUTURE) IMPLANT
SYR CONTROL 10ML LL (SYRINGE) ×3 IMPLANT
TOWEL OR 17X24 6PK STRL BLUE (TOWEL DISPOSABLE) ×3 IMPLANT
TRAY FOLEY CATH SILVER 14FR (SET/KITS/TRAYS/PACK) IMPLANT

## 2016-10-12 NOTE — Progress Notes (Signed)
S; comfortable With Epidural Pitocin 67miu  O: VE 5/80/-2 LOA asynclitic  Amniotomy: copious clear AF IUPC/ISE placed Tracing: baseline 140 (+) accel. Good variability Ctx q 2- 81mins( quads)  IMP: active phase with dysfunctional labor pattern Previous C/S.  Polyhydramnios Seizure disorder P) right exaggerated sims. Cont pitocin. Watch FH( variables noted  With 2 ctx since AROM.)

## 2016-10-12 NOTE — H&P (Signed)
Natasha Watson is a 35 y.o. female presenting @ 65 1/[redacted] weeks gestation for IOL 2nd to polyhydramnios noted on office u/s done for decreased FM,z( 2nd complaint this) hx notable for previous pregnancy  Complicated also by polyhydramnios and culminated in primary C/S for arrest of descent due to mentum presentation. Maternal hx notable for seizure disorder managed by lamictal. Pt desires VBAC  OB History    Gravida Para Term Preterm AB Living   2 1 1     1    SAB TAB Ectopic Multiple Live Births           1     Past Medical History:  Diagnosis Date  . Attempted suicide 10/2003  . Bulimia    HISTORY OF  . Hx of varicella   . Polyhydramnios   . Postpartum care following cesarean delivery (1/30) 12/05/2013  . Rubella immune 2009  . Seizure disorder ([place]) 03/2004   DR. CARMEN DOHMEIER FOLLOWS HER FOR THIS.  . Seizures ([place])    Last seizure 2013   Past Surgical History:  Procedure Laterality Date  . CESAREAN SECTION N/A 12/04/2013   Procedure: CESAREAN SECTION;  Surgeon: Marvene Staff, MD;  Location: [facility];  Service: Obstetrics;  Laterality: N/A;  . WISDOM TOOTH EXTRACTION  06/2004   Family History: family history includes Breast cancer in her maternal grandmother and paternal aunt; Breast cancer (age of onset: 66) in her mother; Cancer in her paternal aunt; Heart disease in her maternal grandfather; Hyperlipidemia in her maternal grandfather. Social History:  reports that she quit smoking about 15 years ago. She has never used smokeless tobacco. She reports that she drinks alcohol. She reports that she does not use drugs.     Maternal Diabetes: No Genetic Screening: Normal Maternal Ultrasounds/Referrals: Normal Fetal Ultrasounds or other Referrals:  None Maternal Substance Abuse:  No Significant Maternal Medications:  Meds include: Other: lamictal Significant Maternal Lab Results:  Lab values include: Group B Strep negative, Rh negative Other Comments:  Rh negative,  seizure disorder  Review of Systems  All other systems reviewed and are negative.  History   Blood pressure 130/76, pulse 97, temperature 98.5 F (36.9 C), temperature source Oral, resp. rate 16, height 5' (1.524 m), weight 74.4 kg (164 lb). Exam Physical Exam  Constitutional: She is oriented to person, place, and time. She appears well-developed and well-nourished.  HENT:  Head: Atraumatic.  Eyes: EOM are normal.  Neck: Neck supple.  Cardiovascular: Regular rhythm.   Respiratory: Breath sounds normal.  GI: Soft.  Musculoskeletal: She exhibits edema.  Neurological: She is alert and oriented to person, place, and time.  Skin: Skin is warm and dry.  Psychiatric: She has a normal mood and affect.   VE 1/60/-3  Prenatal labs: ABO, Rh:  A negative Antibody:  neg Rubella:  Immune RPR:   NR HBsAg:  neg  HIV:   neg GBS:   neg  Assessment/Plan: Polyhydramnios  Previous LTCS Seizure disorder Rh negative Term gestation P) admit routine labs. Pitocin. Intracervical balloon. Analgesic prn. Cont lamictal  Branon Sabine A 10/12/2016, 10:42 AM

## 2016-10-12 NOTE — Anesthesia Preprocedure Evaluation (Signed)
Anesthesia Evaluation  Patient identified by MRN, date of birth, ID band Patient awake    Reviewed: Allergy & Precautions, H&P , NPO status , Patient's Chart, lab work & pertinent test results  History of Anesthesia Complications Negative for: history of anesthetic complications  Airway Mallampati: II  TM Distance: >3 FB Neck ROM: full    Dental no notable dental hx. (+) Teeth Intact   Pulmonary neg pulmonary ROS, former smoker,    Pulmonary exam normal breath sounds clear to auscultation       Cardiovascular negative cardio ROS Normal cardiovascular exam Rhythm:regular Rate:Normal     Neuro/Psych Seizures -, Well Controlled,  negative psych ROS   GI/Hepatic negative GI ROS, Neg liver ROS,   Endo/Other  negative endocrine ROS  Renal/GU negative Renal ROS  negative genitourinary   Musculoskeletal   Abdominal   Peds  Hematology negative hematology ROS (+)   Anesthesia Other Findings   Reproductive/Obstetrics (+) Pregnancy                             Anesthesia Physical Anesthesia Plan  ASA: II  Anesthesia Plan: Epidural   Post-op Pain Management:    Induction:   Airway Management Planned:   Additional Equipment:   Intra-op Plan:   Post-operative Plan:   Informed Consent: I have reviewed the patients History and Physical, chart, labs and discussed the procedure including the risks, benefits and alternatives for the proposed anesthesia with the patient or authorized representative who has indicated his/her understanding and acceptance.     Plan Discussed with:   Anesthesia Plan Comments:         Anesthesia Quick Evaluation

## 2016-10-12 NOTE — Progress Notes (Signed)
S: breathing with ctx  O: VE balloon removed Loose 4cm/70/-3 deviated to mat right bulging membrane  Tracing: baseline 140 (+) accel to 160 Ctx q 2- 2  1/2 mins  IMP: latent/active phase Polyhydramnios Term gestation Previous C/S P) epidural,  left exaggerated sims. Cont pitocin. Controlled amniotomy prn

## 2016-10-12 NOTE — Anesthesia Procedure Notes (Signed)
Epidural Patient location during procedure: OB  Staffing Anesthesiologist: Emersynn Deatley Performed: anesthesiologist   Preanesthetic Checklist Completed: patient identified, site marked, surgical consent, pre-op evaluation, timeout performed, IV checked, risks and benefits discussed and monitors and equipment checked  Epidural Patient position: sitting Prep: DuraPrep Patient monitoring: heart rate, continuous pulse ox and blood pressure Approach: right paramedian Location: L3-L4 Injection technique: LOR saline  Needle:  Needle type: Tuohy  Needle gauge: 17 G Needle length: 9 cm and 9 Needle insertion depth: 5 cm Catheter type: closed end flexible Catheter size: 20 Guage Catheter at skin depth: 9 cm Test dose: negative  Assessment Events: blood not aspirated, injection not painful, no injection resistance, negative IV test and no paresthesia  Additional Notes Patient identified. Risks/Benefits/Options discussed with patient including but not limited to bleeding, infection, nerve damage, paralysis, failed block, incomplete pain control, headache, blood pressure changes, nausea, vomiting, reactions to medication both or allergic, itching and postpartum back pain. Confirmed with bedside nurse the patient's most recent platelet count. Confirmed with patient that they are not currently taking any anticoagulation, have any bleeding history or any family history of bleeding disorders. Patient expressed understanding and wished to proceed. All questions were answered. Sterile technique was used throughout the entire procedure. Please see nursing notes for vital signs. Test dose was given through epidural needle and negative prior to continuing to dose epidural or start infusion. Warning signs of high block given to the patient including shortness of breath, tingling/numbness in hands, complete motor block, or any concerning symptoms with instructions to call for help. Patient was given  instructions on fall risk and not to get out of bed. All questions and concerns addressed with instructions to call with any issues.     

## 2016-10-12 NOTE — Anesthesia Pain Management Evaluation Note (Signed)
  CRNA Pain Management Visit Note  Patient: Natasha Watson, 35 y.o., female  "Hello I am a member of the anesthesia team at [facility]. We have an anesthesia team available at all times to provide care throughout the hospital, including epidural management and anesthesia for C-section. I don't know your plan for the delivery whether it a natural birth, water birth, IV sedation, nitrous supplementation, doula or epidural, but we want to meet your pain goals."   1.Was your pain managed to your expectations on prior hospitalizations?   Yes   2.What is your expectation for pain management during this hospitalization?     Epidural and Nitrous Oxide  3.How can we help you reach that goal? Epidural   Record the patient's initial score and the patient's pain goal.   Pain: 5  Pain Goal: 7 [facility] wants you to be able to say your pain was always managed very well.  [name] 10/12/2016

## 2016-10-12 NOTE — Progress Notes (Signed)
Tracing noted since AROM: deep variables decelerations with ctx Ctx q 2-3 mins: Pitocin d/c'ed. Maternal positioning w/o any change. (+) scalp stimulation VE unchanged. No palp cord IMP: variable deceleration due to cord compression However prev C/S attempting VBAC Disc with pt amnioinfusion vs repeat C/S.  as this was being disc, FHR decreased to  vacillating 80's to 100's that then overshoots baseline. Decision made: Gypsy terbutaline given OR called for proceeding with repeat C/S. Risk of surgery reviewed with [id number] infection, bleeding, injury to bladder, bowel, ureter, internal scar tissue. All ? answered

## 2016-10-12 NOTE — Transfer of Care (Signed)
Immediate Anesthesia Transfer of Care Note  Patient: Natasha Watson  Procedure(s) Performed: Procedure(s): CESAREAN SECTION (N/A)  Patient Location: PACU  Anesthesia Type:Epidural  Level of Consciousness: awake, alert  and oriented  Airway & Oxygen Therapy: Patient Spontanous Breathing  Post-op Assessment: Report given to RN and Post -op Vital signs reviewed and stable  Post vital signs: Reviewed and stable  Last Vitals:  Vitals:   10/12/16 1900 10/12/16 1925  BP: (!) 103/58   Pulse: 73   Resp:    Temp:  36.6 C    Last Pain:  Vitals:   10/12/16 2042  TempSrc:   PainSc: 0-No pain         Complications: No apparent anesthesia complications

## 2016-10-12 NOTE — Brief Op Note (Signed)
10/12/2016  9:51 PM  PATIENT:  Natasha Watson  35 y.o. female  PRE-OPERATIVE DIAGNOSIS:  fetal intolerance to labor, previous c-section, polyhydramnios, term gestation   POST-OPERATIVE DIAGNOSIS:  fetal intolerance to labor, previous c-section , polyhydramnios, term gestation  PROCEDURE:  Repeat Cesarean section, kerr hysterotomy  SURGEON:  Surgeon(s) and Role:    * Servando Salina, MD - Primary  PHYSICIAN ASSISTANT:   ASSISTANTS: Artelia Laroche, CNM   ANESTHESIA:   epidural FINDINGS; live female LOP, nl tubes and ovaries EBL:  Total I/O In: 1000 [I.V.:1000] Out: 1400 [Urine:800; Blood:600]  BLOOD ADMINISTERED:none  DRAINS: none   LOCAL MEDICATIONS USED:  MARCAINE     SPECIMEN:  Source of Specimen:  placenta  DISPOSITION OF SPECIMEN:  PATHOLOGY  COUNTS:  YES  TOURNIQUET:  * No tourniquets in log *  DICTATION: .Other Dictation: Dictation Number [id number]  PLAN OF CARE: Admit to inpatient   PATIENT DISPOSITION:  PACU - hemodynamically stable.   Delay start of Pharmacological VTE agent (>24hrs) due to surgical blood loss or risk of bleeding: no

## 2016-10-13 ENCOUNTER — Encounter (HOSPITAL_COMMUNITY): Payer: Self-pay | Admitting: Obstetrics and Gynecology

## 2016-10-13 LAB — CBC
HCT: 30.6 % — ABNORMAL LOW (ref 36.0–46.0)
HEMOGLOBIN: 10.6 g/dL — AB (ref 12.0–15.0)
MCH: 31.2 pg (ref 26.0–34.0)
MCHC: 34.6 g/dL (ref 30.0–36.0)
MCV: 90 fL (ref 78.0–100.0)
Platelets: 174 10*3/uL (ref 150–400)
RBC: 3.4 MIL/uL — AB (ref 3.87–5.11)
RDW: 13 % (ref 11.5–15.5)
WBC: 14.1 10*3/uL — ABNORMAL HIGH (ref 4.0–10.5)

## 2016-10-13 LAB — RPR: RPR: NONREACTIVE

## 2016-10-13 MED ORDER — SENNOSIDES-DOCUSATE SODIUM 8.6-50 MG PO TABS
2.0000 | ORAL_TABLET | ORAL | Status: DC
  Administered 2016-10-14: 2 via ORAL
  Filled 2016-10-13: qty 2

## 2016-10-13 MED ORDER — MENTHOL 3 MG MT LOZG: 1.0000 | LOZENGE | OROMUCOSAL | Status: DC | PRN

## 2016-10-13 MED ORDER — NALBUPHINE HCL 10 MG/ML IJ SOLN: 5.0000 mg | Freq: Once | INTRAMUSCULAR | Status: DC | PRN

## 2016-10-13 MED ORDER — HYDROMORPHONE HCL 2 MG PO TABS
4.0000 mg | ORAL_TABLET | ORAL | Status: DC | PRN
  Administered 2016-10-13 – 2016-10-14 (×3): 4 mg via ORAL
  Filled 2016-10-13 (×3): qty 2

## 2016-10-13 MED ORDER — RHO D IMMUNE GLOBULIN 1500 UNIT/2ML IJ SOSY
300.0000 ug | PREFILLED_SYRINGE | Freq: Once | INTRAMUSCULAR | Status: AC
  Administered 2016-10-13: 300 ug via INTRAVENOUS
  Filled 2016-10-13: qty 2

## 2016-10-13 MED ORDER — DIPHENHYDRAMINE HCL 50 MG/ML IJ SOLN: 12.5000 mg | INTRAMUSCULAR | Status: DC | PRN

## 2016-10-13 MED ORDER — ZOLPIDEM TARTRATE 5 MG PO TABS: 5.0000 mg | ORAL_TABLET | Freq: Every evening | ORAL | Status: DC | PRN

## 2016-10-13 MED ORDER — KETOROLAC TROMETHAMINE 30 MG/ML IJ SOLN: 30.0000 mg | Freq: Four times a day (QID) | INTRAMUSCULAR | Status: AC | PRN

## 2016-10-13 MED ORDER — PRENATAL MULTIVITAMIN CH
1.0000 | ORAL_TABLET | Freq: Every day | ORAL | Status: DC
  Administered 2016-10-13: 1 via ORAL
  Filled 2016-10-13: qty 1

## 2016-10-13 MED ORDER — KETOROLAC TROMETHAMINE 30 MG/ML IJ SOLN
30.0000 mg | Freq: Four times a day (QID) | INTRAMUSCULAR | Status: AC | PRN
  Administered 2016-10-13: 30 mg via INTRAVENOUS
  Filled 2016-10-13: qty 1

## 2016-10-13 MED ORDER — SODIUM CHLORIDE 0.9% FLUSH: 3.0000 mL | INTRAVENOUS | Status: DC | PRN

## 2016-10-13 MED ORDER — NALBUPHINE HCL 10 MG/ML IJ SOLN: 5.0000 mg | INTRAMUSCULAR | Status: DC | PRN

## 2016-10-13 MED ORDER — NALOXONE HCL 0.4 MG/ML IJ SOLN: 0.4000 mg | INTRAMUSCULAR | Status: DC | PRN

## 2016-10-13 MED ORDER — MIDAZOLAM HCL 2 MG/2ML IJ SOLN
INTRAMUSCULAR | Status: AC
  Filled 2016-10-13: qty 2

## 2016-10-13 MED ORDER — DIPHENHYDRAMINE HCL 25 MG PO CAPS: 25.0000 mg | ORAL_CAPSULE | Freq: Four times a day (QID) | ORAL | Status: DC | PRN

## 2016-10-13 MED ORDER — DIBUCAINE 1 % RE OINT: 1.0000 "application " | TOPICAL_OINTMENT | RECTAL | Status: DC | PRN

## 2016-10-13 MED ORDER — SIMETHICONE 80 MG PO CHEW
80.0000 mg | CHEWABLE_TABLET | Freq: Three times a day (TID) | ORAL | Status: DC
  Administered 2016-10-13 (×3): 80 mg via ORAL
  Filled 2016-10-13 (×3): qty 1

## 2016-10-13 MED ORDER — SCOPOLAMINE 1 MG/3DAYS TD PT72: 1.0000 | MEDICATED_PATCH | Freq: Once | TRANSDERMAL | Status: DC

## 2016-10-13 MED ORDER — FENTANYL CITRATE (PF) 250 MCG/5ML IJ SOLN
INTRAMUSCULAR | Status: AC
  Filled 2016-10-13: qty 5

## 2016-10-13 MED ORDER — IBUPROFEN 600 MG PO TABS
600.0000 mg | ORAL_TABLET | Freq: Four times a day (QID) | ORAL | Status: DC
  Administered 2016-10-13 – 2016-10-14 (×5): 600 mg via ORAL
  Filled 2016-10-13 (×5): qty 1

## 2016-10-13 MED ORDER — SIMETHICONE 80 MG PO CHEW: 80.0000 mg | CHEWABLE_TABLET | ORAL | Status: DC | PRN

## 2016-10-13 MED ORDER — DIPHENHYDRAMINE HCL 25 MG PO CAPS: 25.0000 mg | ORAL_CAPSULE | ORAL | Status: DC | PRN

## 2016-10-13 MED ORDER — OXYTOCIN 40 UNITS IN LACTATED RINGERS INFUSION - SIMPLE MED: 2.5000 [IU]/h | INTRAVENOUS | Status: AC

## 2016-10-13 MED ORDER — ONDANSETRON HCL 4 MG/2ML IJ SOLN: 4.0000 mg | Freq: Three times a day (TID) | INTRAMUSCULAR | Status: DC | PRN

## 2016-10-13 MED ORDER — LACTATED RINGERS IV SOLN
INTRAVENOUS | Status: DC
  Administered 2016-10-13: 08:00:00 via INTRAVENOUS

## 2016-10-13 MED ORDER — SIMETHICONE 80 MG PO CHEW
80.0000 mg | CHEWABLE_TABLET | ORAL | Status: DC
  Administered 2016-10-14: 80 mg via ORAL
  Filled 2016-10-13: qty 1

## 2016-10-13 MED ORDER — WITCH HAZEL-GLYCERIN EX PADS: 1.0000 "application " | MEDICATED_PAD | CUTANEOUS | Status: DC | PRN

## 2016-10-13 MED ORDER — COCONUT OIL OIL: 1.0000 "application " | TOPICAL_OIL | Status: DC | PRN

## 2016-10-13 MED ORDER — NALOXONE HCL 2 MG/2ML IJ SOSY: 1.0000 ug/kg/h | PREFILLED_SYRINGE | INTRAVENOUS | Status: DC | PRN

## 2016-10-13 MED ORDER — LAMOTRIGINE 100 MG PO TABS
300.0000 mg | ORAL_TABLET | Freq: Every day | ORAL | Status: DC
  Administered 2016-10-13 (×2): 300 mg via ORAL
  Filled 2016-10-13 (×2): qty 3

## 2016-10-13 NOTE — Clinical Social Work Maternal (Signed)
  CLINICAL SOCIAL WORK MATERNAL/CHILD NOTE  Patient Details  Name: Natasha Watson MRN: [id number] Date of Birth: [birth year]  Date:  10/13/2016  Clinical Social Worker Initiating Note:  Ferdinand Lango Alithia Zavaleta, MSW, LCSW-A  Date/ Time Initiated:  10/13/16/1350     Child's Name:  Natasha Watson    Legal Guardian:  Other (Comment) (Not estbalished by court system; MOB and FOB parent collectively )   Need for Interpreter:  None   Date of Referral:  10/12/16     Reason for Referral:  Other (Comment) (MOB hx of suicide attempt 2004 )   Referral Source:  [facility]   Address:  [place] [address]  Phone number:  [phone]   Household Members:  Self, Spouse, Minor Children   Natural Supports (not living in the home):  Immediate Family, Extended Family, Friends   Medical illustrator Supports: None   Employment:     Type of Work:     Education:      Pensions consultant:  Multimedia programmer   Other Resources:      Cultural/Religious Considerations Which May Impact Care:  None reported.   Strengths:  Ability to meet basic needs , Compliance with medical plan , Home prepared for child , Pediatrician chosen  Investment banker, [facility] )   Risk Factors/Current Problems:  None   Cognitive State:  Alert , Able to Concentrate , Goal Oriented , Insightful    Mood/Affect:  Calm , Comfortable , Interested    CSW Assessment: CSW met with MOB at bedside to complete assessment. At this time, MOB was alone feeding baby in bed. This baby explained role and reasoning for visit. At this time, CSW did not explore concern noted on consult in order to not evoke past emotions MOB had 10+ years ago. This Probation officer assessed MOB's psychosocial needs. At this time, MOB notes this is her second child she and her husband have and she is feeling good thus far. Denis any anxiety and/or depression. MOB denies the need for any resources at this time. This Probation officer discussed PPD and  SIDS. MOB verbalized understanding. At this time, no other needs were addressed or requested. Case closed to this CSW.    CSW Plan/Description:  No Further Intervention Required/No Barriers to Discharge   [name], MSW, [facility]  Office: [phone]

## 2016-10-13 NOTE — Plan of Care (Signed)
Problem: Safety: Goal: Ability to remain free from injury will improve Outcome: Completed/Met Date Met: 10/13/16 Encouraged patient to only ambulate with assistance until otherwise instructed after she has ambulated well with assistance.  Problem: Nutrition: Goal: Adequate nutrition will be maintained Outcome: Completed/Met Date Met: 10/13/16 Advancing to regular diet.    Problem: Activity: Goal: Will verbalize the importance of balancing activity with adequate rest periods Outcome: Completed/Met Date Met: 10/13/16 Discussed activity progression once foley and IV lines are discontinued.   Problem: Nutritional: Goal: Mothers verbalization of comfort with breastfeeding process will improve Outcome: Not Applicable Date Met: 10/13/16 Mother bottle feeding due to medication mother taking.   

## 2016-10-13 NOTE — Anesthesia Postprocedure Evaluation (Signed)
Anesthesia Post Note  Patient: Natasha Watson  Procedure(s) Performed: Procedure(s) (LRB): CESAREAN SECTION (N/A)  Patient location during evaluation: Mother Baby Anesthesia Type: Epidural Level of consciousness: awake Pain management: satisfactory to patient Vital Signs Assessment: post-procedure vital signs reviewed and stable Respiratory status: spontaneous breathing Cardiovascular status: stable Anesthetic complications: no     Last Vitals:  Vitals:   10/13/16 0245 10/13/16 0650  BP: (!) 99/57 (!) 96/57  Pulse: 77 64  Resp: 18 20  Temp: 37.2 C 37 C    Last Pain:  Vitals:   10/13/16 0650  TempSrc: Oral  PainSc: 1    Pain Goal:                 [employer]

## 2016-10-13 NOTE — Progress Notes (Signed)
POSTOPERATIVE DAY # 1 S/P CS   S:         Reports feeling ok             Tolerating po intake / no nausea / no vomiting / no flatus / no BM             Bleeding is light             Pain controlled with motrin             No OOB yet  Newborn breast feeding    O:  VS: BP (!) 96/57 (BP Location: Left Arm)   Pulse 64   Temp 98.6 F (37 C) (Oral)   Resp 20   Ht 5' (1.524 m)   Wt 74.4 kg (164 lb)   SpO2 95%   Breastfeeding? Unknown   BMI 32.03 kg/m    LABS:               Recent Labs  10/12/16 1051 10/13/16 0531  WBC 9.7 14.1*  HGB 11.8* 10.6*  PLT 207 174               Bloodtype: --/--/A NEG (12/09 0531)                         I&O: Intake/Output      12/08 0701 - 12/09 0700 12/09 0701 - 12/10 0700   I.V. (mL/kg) 1300 (17.5)    Total Intake(mL/kg) 1300 (17.5)    Urine (mL/kg/hr) 3250    Blood 600    Total Output 3850     Net -2550                    Physical Exam:             Alert and Oriented X3  Lungs: Clear and unlabored  Heart: regular rate and rhythm / no mumurs  Abdomen: soft, non-tender, non-distended              Fundus: firm, non-tender, U-1             Dressing intact              Foley dark amber urine             Incision:  approximated with suture / no erythema / no ecchymosis / no drainage  Perineum: intact  Lochia: light  Extremities: no edema, no calf pain or tenderness, SCD in place  A:        POD # 1 S/P CS             P:        Routine postoperative care              Increase water intake / activity     Artelia Laroche CNM, MSN, FACNM 10/13/2016, 11:16 AM

## 2016-10-14 LAB — RH IG WORKUP (INCLUDES ABO/RH)
ABO/RH(D): A NEG
Fetal Screen: NEGATIVE
GESTATIONAL AGE(WKS): 39.1
Unit division: 0

## 2016-10-14 MED ORDER — HYDROMORPHONE HCL 4 MG PO TABS: 4.0000 mg | ORAL_TABLET | ORAL | 0 refills | Status: DC | PRN

## 2016-10-14 MED ORDER — IBUPROFEN 600 MG PO TABS: 600.0000 mg | ORAL_TABLET | Freq: Four times a day (QID) | ORAL | 0 refills | Status: AC

## 2016-10-14 NOTE — Progress Notes (Signed)
SVD: repeat  S:  Pt reports feeling except soreness/ Tolerating po/ Voiding without problems/ No n/v/ Bleeding is moderate/ Pain controlled withnarcotic analgesics including Percocet  O:  A & O x 3  stable/ VS: Blood pressure 108/67, pulse 92, temperature 98 F (36.7 C), temperature source Oral, resp. rate 18, height 5' (1.524 m), weight 74.4 kg (164 lb), SpO2 96 %, unknown if currently breastfeeding.  LABS: No results found for this or any previous visit (from the past 24 hour(s)).  I&O: I/O last 3 completed shifts: In: 4536.5 [P.O.:2400; I.V.:2136.5] Out: [id number] [Urine:5450; Blood:600]   No intake/output data recorded.  Lungs: chest clear, no wheezing, rales, normal symmetric air entry, Heart exam - S1, S2 normal, no murmur, no gallop, rate regular  Heart: regular rate and rhythm  Abdomen: uterus firm 1 FB above um surgical tender, soft nondistended  Perineum: is normal  Lochia: mod   Extremities:edema !+    A/P: POD # 2/PPD # 2/ [id number]  Doing well  Continue routine post partum orders  D/c home F/u 6 weeks pp  d/c instructions reviewed: Pp booklet given Scripts: motrin, percocet

## 2016-10-14 NOTE — Discharge Summary (Signed)
OB Discharge Summary     Patient Name: Natasha Watson DOB: Apr 27, 1981 MRN: [id number]  Date of admission: 10/12/2016 Delivering MD: Navi Ewton   Date of discharge: 10/14/2016  Admitting diagnosis: Polyhydramnios, previous C/S, seizure disorder Intrauterine pregnancy: [redacted]w[redacted]d     Secondary diagnosis:  Principal Problem:   Postpartum care following cesarean delivery (12/8) Active Problems:   Labor and delivery, indication for care polyhydramnios Seizure disorder Additional problems: seizure disorder     Discharge diagnosis: Term Pregnancy Delivered and failed TOLAC , seizure disorder, polyhydramnios                                                                                               Post partum procedures:rhogam  Augmentation: AROM, Pitocin and Foley Balloon  Complications: None  Hospital course:  Induction of Labor With Cesarean Section  35 y.o. yo G2P2002 at [redacted]w[redacted]d was admitted to the hospital 10/12/2016 for induction of labor. Patient had a labor course significant for  Intracervical balloon and pitocin  Along with AROM copious clear AF. IUPC/ISE placement. Pt then had repetitive variable deceleration. The patient went for cesarean section due to fetal intolerance to labor, and delivered a Viable infant live female  Membrane Rupture Time/Date: )7:18 PM ,10/12/2016   @Details  of operation can be found in separate operative Note.  Patient had an uncomplicated postpartum course. She is ambulating, tolerating a regular diet, passing flatus, and urinating well.  Patient is discharged home in stable condition on 10/15/16.                                     Physical exam Vitals:   10/13/16 1045 10/13/16 1400 10/13/16 1800 10/14/16 0507  BP: 92/70 (!) 92/50 (!) 90/51 108/67  Pulse: 73 84 76 92  Resp: 18 20 16 18   Temp: 98.5 F (36.9 C) 98.6 F (37 C) 98.2 F (36.8 C) 98 F (36.7 C)  TempSrc: Oral Oral Oral Oral  SpO2: 94% 94% 96%   Weight:      Height:        General: alert, cooperative and no distress Lochia: appropriate Uterine Fundus: firm Incision: Dressing is clean, dry, and intact DVT Evaluation: No evidence of DVT seen on physical exam. Calf/Ankle edema is present Labs: Lab Results  Component Value Date   WBC 14.1 (H) 10/13/2016   HGB 10.6 (L) 10/13/2016   HCT 30.6 (L) 10/13/2016   MCV 90.0 10/13/2016   PLT 174 10/13/2016   CMP Latest Ref Rng & Units 05/07/2016  Glucose 65 - 99 mg/dL 90  BUN 6 - 20 mg/dL 13  Creatinine 0.44 - 1.00 mg/dL 0.51  Sodium 135 - 145 mmol/L 135  Potassium 3.5 - 5.1 mmol/L 3.8  Chloride 101 - 111 mmol/L 104  CO2 22 - 32 mmol/L 22  Calcium 8.9 - 10.3 mg/dL 8.8(L)  Total Protein 6.5 - 8.1 g/dL 7.0  Total Bilirubin 0.3 - 1.2 mg/dL 0.3  Alkaline Phos 38 - 126 U/L 54  AST 15 - 41 U/L 16  ALT 14 - 54 U/L 18    Discharge instruction: per After Visit Summary and "Baby and Me Booklet".  After visit meds:    Medication List    STOP taking these medications   ondansetron 4 MG disintegrating tablet Commonly known as:  ZOFRAN ODT     TAKE these medications   FOLIC ACID PO Take 1 tablet by mouth at bedtime.   HYDROmorphone 4 MG tablet Commonly known as:  DILAUDID Take 1 tablet (4 mg total) by mouth every 4 (four) hours as needed for severe pain.   ibuprofen 600 MG tablet Commonly known as:  ADVIL,MOTRIN Take 1 tablet (600 mg total) by mouth every 6 (six) hours.   lamoTRIgine 100 MG tablet Commonly known as:  LAMICTAL Take 300 mg by mouth at bedtime.   prenatal multivitamin Tabs tablet Take 1 tablet by mouth daily at 12 noon.       Diet: routine diet  Activity: Advance as tolerated. Pelvic rest for 6 weeks.   Outpatient follow up:6 weeks Follow up Appt:No future appointments. Follow up Visit:6 weeks postpartum  Postpartum contraception: IUD Paragard  Newborn Data: Live born female  Birth Weight: 6 lb 9.8 oz (3000 g) APGAR: 9, 9  Baby Feeding: Bottle Disposition:home with  mother   10/14/2016 Marvene Staff, MD

## 2016-10-14 NOTE — Plan of Care (Signed)
Problem: Education: Goal: Knowledge of condition will improve Discussed discharge education and incision care with patient. Patient verbalizes understanding of information.

## 2016-10-15 NOTE — Op Note (Signed)
Natasha Watson, Natasha Watson NO.:  [id number]  MEDICAL RECORD NO.:  [id number]  LOCATION:                                 FACILITY:  PHYSICIAN:  Servando Salina, M.D.DATE OF BIRTH:  11/25/1980  DATE OF PROCEDURE:  10/12/2016 DATE OF DISCHARGE:  10/14/2016                              OPERATIVE REPORT   PREOPERATIVE DIAGNOSIS:  Fetal intolerance to labor, polyhydramnios, previous cesarean section, term gestation.  PROCEDURE:  Repeat cesarean section, Kerr hysterotomy.  POSTOPERATIVE DIAGNOSIS:  Fetal intolerance to labor, polyhydramnios, previous cesarean section, term gestation.  ANESTHESIA:  Epidural.  SURGEON:  Servando Salina, M.D.  ASSISTANT:  Artelia Laroche, C.N.M.  DESCRIPTION OF PROCEDURE:  Under adequate epidural anesthesia, the patient was placed in the supine position with a left lateral tilt.  An indwelling Foley catheter was already in place.  0.25% Marcaine was injected along the previous Pfannenstiel skin incision.  Pfannenstiel skin incision was then made carried down to the rectus fascia.  Rectus fascia opened transversely.  The rectus fascia was then bluntly and sharply dissected off the rectus muscle in superior and inferior fashion.  The rectus muscle was split in the midline.  The parietal peritoneum was entered sharply and extended.  A self-retaining Alexis retractor was then placed.  The vesicouterine peritoneum was opened transversely.  The bladder was bluntly and sharply dissected off the lower uterine segment and displaced inferiorly.  A curvilinear low- transverse intrauterine incision was then made and extended with bandage scissors.  Continued clear amniotic fluid was noted.  Subsequent delivery of a live female from the left occiput posterior presentation was accomplished.  The baby was bulb suctioned in the abdomen.  The cord was clamped after delayed for minute.  The baby was subsequently transferred to the awaiting  pediatricians who assigned Apgars of 9 and 9 at 1 and 5 minutes.  Placenta was spontaneously intact, sent to Pathology.  Uterine cavity was then cleaned of debris.  Uterine incision was closed in 2 layers, the first layer with 0 Monocryl running lock stitch.  Second layer imbricating with 0 Monocryl suture.  Normal tubes and ovaries were noted bilaterally.  The abdomen was irrigated and suctioned of debris. Interceed was placed over lower uterine segment in an inverted T fashion.  The parietal peritoneum was then closed with 2-0 Vicryl.  The rectus fascia was closed with 0 Vicryl x2.  The subcutaneous area was irrigated, small bleeders cauterized.  Interrupted 2-0 plain sutures placed and skin approximated with 4-0 Vicryl subcuticular closure.  SPECIMENS:  Placenta sent to Pathology.  ESTIMATED BLOOD LOSS:  600 mL.  INTRAOPERATIVE FLUIDS:  1300 mL.  URINE OUTPUT:  150 mL.  SPONGE AND INSTRUMENT COUNT:  Counts x2 was correct.  COMPLICATION:  None.  The patient tolerated the procedure well and was transferred to the recovery room in stable condition.     Servando Salina, M.D.     [place]/MEDQ  D:  10/12/2016  T:  10/13/2016  Job:  [id number]

## 2016-12-22 ENCOUNTER — Emergency Department (HOSPITAL_COMMUNITY): Payer: 59

## 2016-12-22 ENCOUNTER — Encounter (HOSPITAL_COMMUNITY): Payer: Self-pay | Admitting: Oncology

## 2016-12-22 ENCOUNTER — Emergency Department (HOSPITAL_COMMUNITY)
Admission: EM | Admit: 2016-12-22 | Discharge: 2016-12-23 | Disposition: A | Discharge status: Home / Self Care | Payer: 59 | Attending: Emergency Medicine | Admitting: Emergency Medicine

## 2016-12-22 DIAGNOSIS — S43011A Anterior subluxation of right humerus, initial encounter: Secondary | ICD-10-CM | POA: Insufficient documentation

## 2016-12-22 DIAGNOSIS — Z87891 Personal history of nicotine dependence: Secondary | ICD-10-CM | POA: Diagnosis not present

## 2016-12-22 DIAGNOSIS — Z79899 Other long term (current) drug therapy: Secondary | ICD-10-CM | POA: Diagnosis not present

## 2016-12-22 DIAGNOSIS — Y929 Unspecified place or not applicable: Secondary | ICD-10-CM | POA: Diagnosis not present

## 2016-12-22 DIAGNOSIS — Y999 Unspecified external cause status: Secondary | ICD-10-CM | POA: Insufficient documentation

## 2016-12-22 DIAGNOSIS — Y939 Activity, unspecified: Secondary | ICD-10-CM | POA: Diagnosis not present

## 2016-12-22 DIAGNOSIS — X58XXXA Exposure to other specified factors, initial encounter: Secondary | ICD-10-CM | POA: Insufficient documentation

## 2016-12-22 DIAGNOSIS — S4991XA Unspecified injury of right shoulder and upper arm, initial encounter: Secondary | ICD-10-CM | POA: Diagnosis present

## 2016-12-22 LAB — CBG MONITORING, ED: Glucose-Capillary: 90 mg/dL (ref 65–99)

## 2016-12-22 MED ORDER — SODIUM CHLORIDE 0.9 % IV BOLUS (SEPSIS)
1000.0000 mL | Freq: Once | INTRAVENOUS | Status: AC
  Administered 2016-12-23: 1000 mL via INTRAVENOUS

## 2016-12-22 MED ORDER — MORPHINE SULFATE (PF) 4 MG/ML IV SOLN
4.0000 mg | Freq: Once | INTRAVENOUS | Status: AC
Start: 2016-12-23 — End: 2016-12-23
  Administered 2016-12-23: 4 mg via INTRAVENOUS
  Filled 2016-12-22: qty 1

## 2016-12-22 NOTE — ED Provider Notes (Signed)
[facility] DEPT Provider Note   CSN: [id number] Arrival date & time: 12/22/16  2324   By signing my name below, I, Eunice Blase, attest that this documentation has been prepared under the direction and in the presence of Everlene Balls, MD. Electronically signed, Eunice Blase, ED Scribe. 12/22/16. 11:47 PM.   History   Chief Complaint Chief Complaint  Patient presents with  . Shoulder Pain   The history is provided by the patient and medical records. No language interpreter was used.  Shoulder Pain      HPI Comments: TIARRA KIOUS is a 36 y.o. female with Hx of epilepsy who presents to the Emergency Department complaining of a seizure this evening 1-2 hours prior to evaluation. Pt states she last remembers taking 300mg  lamictal and putting her child to bed before checking her e-mail and losing consciousness. She believes that that she may have dislocated her right shoulder during her seizure, and she notes 10/10 pain to the shoulder. She adds that she has a 2 children at home causing decreased sleep recently, and she also reveals Hx of recent bronchitis with productive cough with brown sputum. Last seizure in 2012. Pt seen by PCP on 12/18/2016 for bronchitis and prescribed z-pack.  Past Medical History:  Diagnosis Date  . Attempted suicide 10/2003  . Bulimia    HISTORY OF  . Hx of varicella   . Polyhydramnios   . Postpartum care following cesarean delivery (1/30) 12/05/2013  . Rubella immune 2009  . Seizure disorder ([place]) 03/2004   DR. CARMEN DOHMEIER FOLLOWS HER FOR THIS.  . Seizures ([place])    Last seizure 2013    Patient Active Problem List   Diagnosis Date Noted  . Labor and delivery, indication for care 10/12/2016  . Postpartum care following cesarean delivery (12/8) 12/05/2013  . Seizure disorder [facility])     Past Surgical History:  Procedure Laterality Date  . CESAREAN SECTION N/A 12/04/2013   Procedure: CESAREAN SECTION;  Surgeon: Marvene Staff, MD;   Location: [facility];  Service: Obstetrics;  Laterality: N/A;  . CESAREAN SECTION N/A 10/12/2016   Procedure: CESAREAN SECTION;  Surgeon: Servando Salina, MD;  Location: [facility];  Service: Obstetrics;  Laterality: N/A;  . WISDOM TOOTH EXTRACTION  06/2004    OB History    Gravida Para Term Preterm AB Living   2 2 2     2    SAB TAB Ectopic Multiple Live Births         0 2       Home Medications    Prior to Admission medications   Medication Sig Start Date End Date Taking? Authorizing Provider  azithromycin (ZITHROMAX) 250 MG tablet Take two tablets on the first day and then one tablet every day after. 12/19/16  Yes Historical Provider, MD  FOLIC ACID PO Take 1 tablet by mouth at bedtime.   Yes Historical Provider, MD  lamoTRIgine (LAMICTAL) 100 MG tablet Take 300 mg by mouth at bedtime. 04/30/16  Yes Historical Provider, MD  Prenatal Vit-Fe Fumarate-FA (PRENATAL MULTIVITAMIN) TABS tablet Take 1 tablet by mouth daily at 12 noon.   Yes Historical Provider, MD  HYDROmorphone (DILAUDID) 4 MG tablet Take 1 tablet (4 mg total) by mouth every 4 (four) hours as needed for severe pain. Patient not taking: Reported on 12/22/2016 10/14/16   Servando Salina, MD  ibuprofen (ADVIL,MOTRIN) 600 MG tablet Take 1 tablet (600 mg total) by mouth every 6 (six) hours. Patient not taking: Reported on 12/22/2016  10/14/16   Servando Salina, MD    Family History Family History  Problem Relation Age of Onset  . Breast cancer Mother 68  . Breast cancer Paternal Aunt   . Cancer Paternal Aunt     OVARIAN OR UTERINE  . Breast cancer Maternal Grandmother   . Heart disease Maternal Grandfather   . Hyperlipidemia Maternal Grandfather     Social History Social History  Substance Use Topics  . Smoking status: Former Smoker    Quit date: 10/10/2001  . Smokeless tobacco: Never Used  . Alcohol use Yes     Comment: rarely     Allergies   Darvocet [propoxyphene n-acetaminophen] and  Propoxyphene   Review of Systems Review of Systems  All other systems reviewed and are negative.  A complete 10 system review of systems was obtained and all systems are negative except as noted in the HPI and PMH.    Physical Exam Updated Vital Signs BP 104/69 (BP Location: Left Arm)   Pulse 99   Temp 97.7 F (36.5 C) (Oral)   Resp 18   SpO2 96%   Physical Exam  Constitutional: She is oriented to person, place, and time. She appears well-developed and well-nourished. No distress.  HENT:  Head: Normocephalic and atraumatic.  Nose: Nose normal.  Mouth/Throat: Oropharynx is clear and moist. No oropharyngeal exudate.  Eyes: Conjunctivae and EOM are normal. Pupils are equal, round, and reactive to light. No scleral icterus.  Neck: Normal range of motion. Neck supple. No JVD present. No tracheal deviation present. No thyromegaly present.  Cardiovascular: Normal rate, regular rhythm and normal heart sounds.  Exam reveals no gallop and no friction rub.   No murmur heard. Pulmonary/Chest: Effort normal and breath sounds normal. No respiratory distress. She has no wheezes. She exhibits no tenderness.  Abdominal: Soft. Bowel sounds are normal. She exhibits no distension and no mass. There is no tenderness. There is no rebound and no guarding.  Musculoskeletal: Normal range of motion. She exhibits tenderness and deformity. She exhibits no edema.  L shoulder deformity, TTP, normal pulses intact distally  Lymphadenopathy:    She has no cervical adenopathy.  Neurological: She is alert and oriented to person, place, and time. No cranial nerve deficit. She exhibits normal muscle tone.  Skin: Skin is warm and dry. No rash noted. No erythema. No pallor.  Nursing note and vitals reviewed.    ED Treatments / Results  DIAGNOSTIC STUDIES: Oxygen Saturation is 96% on RA, adeqaute by my interpretation.    COORDINATION OF CARE: 11:46 PM Discussed treatment plan with pt at bedside and pt agreed to  plan.  Labs (all labs ordered are listed, but only abnormal results are displayed) Labs Reviewed  RAPID URINE DRUG SCREEN, HOSP PERFORMED - Abnormal; Notable for the following:       Result Value   Opiates POSITIVE (*)    All other components within normal limits  ETHANOL  CBC WITH DIFFERENTIAL/PLATELET  COMPREHENSIVE METABOLIC PANEL  LIPASE, BLOOD  MAGNESIUM  URINALYSIS, ROUTINE W REFLEX MICROSCOPIC  LAMOTRIGINE LEVEL  CBG MONITORING, ED  I-STAT CG4 LACTIC ACID, ED  POC URINE PREG, ED    EKG  EKG Interpretation None       Radiology Dg Chest 2 View  Result Date: 12/23/2016 CLINICAL DATA:  Pain after seizure EXAM: CHEST  2 VIEW COMPARISON:  09/24/2014 CXR report FINDINGS: The heart size and mediastinal contours are within normal limits. Both lungs are clear. No acute osseous abnormality of the  bony thorax. The right shoulder is only partially included. Please refer to the right shoulder radiograph report for further detail. IMPRESSION: No active cardiopulmonary disease. Electronically Signed   By: Ashley Royalty M.D.   On: 12/23/2016 00:36   Dg Shoulder Right  Addendum Date: 12/23/2016   ADDENDUM REPORT: 12/23/2016 02:02 ADDENDUM: An axillary view was acquired after the initial dictation. No dislocation of the humeral head is noted on this projection. Electronically Signed   By: Ashley Royalty M.D.   On: 12/23/2016 02:02   Result Date: 12/23/2016 CLINICAL DATA:  Seizure.  Right shoulder pain. EXAM: RIGHT SHOULDER - 2+ VIEW COMPARISON:  None. FINDINGS: No shoulder dislocation. Slight caudal subluxation of the humeral head relative to the glenoid may reflect capsular laxity. Large Hill-Sachs deformity of the humeral head without bony Bankart lesion. Findings likely reflect sequela of prior shoulder dislocation. The [facility] joint is maintained. IMPRESSION: Hill-Sachs deformity of the humeral head. Slight caudal subluxation of the humeral head relative to the glenoid without dislocation. AC joint  is maintained. Electronically Signed: By: Ashley Royalty M.D. On: 12/23/2016 00:34    Procedures Procedures (including critical care time)  Medications Ordered in ED Medications  sodium chloride 0.9 % bolus 1,000 mL (0 mLs Intravenous Stopped 12/23/16 0115)  morphine 4 MG/ML injection 4 mg (4 mg Intravenous Given 12/23/16 0021)  morphine 4 MG/ML injection 4 mg (4 mg Intravenous Given 12/23/16 0112)     Initial Impression / Assessment and Plan / ED Course  I have reviewed the triage vital signs and the nursing notes.  Pertinent labs & imaging results that were available during my care of the patient were reviewed by me and considered in my medical decision making (see chart for details).     Patient presents to the ED for seizure and R shoulder pain.  She has a known history of seizure and is compliant with medications.  Will check for infection or dehydration as cause of seizure.  Lack of sleep can also cause this.  Will check R shoulder xray for dislocation and reduce if needed.  She was given morphine for pain.  Labs unremarkable. Opiods positive due to morphine given here.  R shoulder xray neg for dislocation. Will DC in sling per Dr. Maureen Ralphs, ortho recs.  Advised to fu with bother her ortho and neurologist for further care.  VS normal. Patient safe for DC.    I personally performed the services described in this documentation, which was scribed in my presence. The recorded information has been reviewed and is accurate.     Final Clinical Impressions(s) / ED Diagnoses   Final diagnoses:  None    New Prescriptions New Prescriptions   No medications on file     Everlene Balls, MD 12/23/16 0211

## 2016-12-22 NOTE — ED Triage Notes (Signed)
Pt has a hx of epilepsy.  Tonight during a seizure pt believes she may have dislocated her right shoulder.  Pt rates the pain 10/10, throbbing and sharp in nature.  Pt is compliant w/ medication.

## 2016-12-23 LAB — RAPID URINE DRUG SCREEN, HOSP PERFORMED
Amphetamines: NOT DETECTED
BARBITURATES: NOT DETECTED
Benzodiazepines: NOT DETECTED
Cocaine: NOT DETECTED
Opiates: POSITIVE — AB
Tetrahydrocannabinol: NOT DETECTED

## 2016-12-23 LAB — I-STAT CG4 LACTIC ACID, ED: LACTIC ACID, VENOUS: 0.72 mmol/L (ref 0.5–1.9)

## 2016-12-23 LAB — POC URINE PREG, ED: Preg Test, Ur: NEGATIVE

## 2016-12-23 LAB — ETHANOL

## 2016-12-23 MED ORDER — MORPHINE SULFATE (PF) 4 MG/ML IV SOLN
4.0000 mg | Freq: Once | INTRAVENOUS | Status: AC
  Administered 2016-12-23: 4 mg via INTRAVENOUS
  Filled 2016-12-23: qty 1

## 2016-12-23 MED ORDER — KETAMINE HCL 10 MG/ML IJ SOLN: 2.0000 mg/kg | Freq: Once | INTRAMUSCULAR | Status: DC

## 2016-12-23 NOTE — ED Notes (Signed)
No reaction to medication noted alert and oriented x 3 visitor at bedside call light in reach.

## 2016-12-23 NOTE — ED Notes (Signed)
Refused in and out catheter.

## 2019-11-11 ENCOUNTER — Ambulatory Visit: Payer: BC Managed Care – PPO | Attending: Internal Medicine

## 2019-11-11 DIAGNOSIS — Z20822 Contact with and (suspected) exposure to covid-19: Secondary | ICD-10-CM

## 2019-11-13 LAB — NOVEL CORONAVIRUS, NAA: SARS-CoV-2, NAA: NOT DETECTED

## 2020-01-20 ENCOUNTER — Ambulatory Visit: Payer: BC Managed Care – PPO | Attending: Internal Medicine

## 2020-01-20 DIAGNOSIS — Z20822 Contact with and (suspected) exposure to covid-19: Secondary | ICD-10-CM

## 2020-01-21 LAB — NOVEL CORONAVIRUS, NAA: SARS-CoV-2, NAA: NOT DETECTED

## 2020-10-03 ENCOUNTER — Encounter (HOSPITAL_COMMUNITY): Payer: Self-pay | Admitting: Emergency Medicine

## 2020-10-03 ENCOUNTER — Other Ambulatory Visit: Payer: Self-pay

## 2020-10-03 ENCOUNTER — Emergency Department (HOSPITAL_COMMUNITY)
Admission: EM | Admit: 2020-10-03 | Discharge: 2020-10-04 | Disposition: A | Discharge status: Home / Self Care | Payer: BC Managed Care – PPO | Attending: Emergency Medicine | Admitting: Emergency Medicine

## 2020-10-03 DIAGNOSIS — Z79899 Other long term (current) drug therapy: Secondary | ICD-10-CM | POA: Diagnosis not present

## 2020-10-03 DIAGNOSIS — Z87891 Personal history of nicotine dependence: Secondary | ICD-10-CM | POA: Diagnosis not present

## 2020-10-03 DIAGNOSIS — K5792 Diverticulitis of intestine, part unspecified, without perforation or abscess without bleeding: Secondary | ICD-10-CM

## 2020-10-03 DIAGNOSIS — R102 Pelvic and perineal pain: Secondary | ICD-10-CM | POA: Diagnosis present

## 2020-10-03 LAB — COMPREHENSIVE METABOLIC PANEL
ALT: 25 U/L (ref 0–44)
AST: 18 U/L (ref 15–41)
Albumin: 4.2 g/dL (ref 3.5–5.0)
Alkaline Phosphatase: 54 U/L (ref 38–126)
Anion gap: 10 (ref 5–15)
BUN: 13 mg/dL (ref 6–20)
CO2: 23 mmol/L (ref 22–32)
Calcium: 9.3 mg/dL (ref 8.9–10.3)
Chloride: 103 mmol/L (ref 98–111)
Creatinine, Ser: 0.8 mg/dL (ref 0.44–1.00)
GFR, Estimated: >60 mL/min (ref >60)
Glucose, Bld: 82 mg/dL (ref 70–99)
Potassium: 4 mmol/L (ref 3.5–5.1)
Sodium: 136 mmol/L (ref 135–145)
Total Bilirubin: 0.5 mg/dL (ref 0.3–1.2)
Total Protein: 7 g/dL (ref 6.5–8.1)

## 2020-10-03 LAB — URINALYSIS, ROUTINE W REFLEX MICROSCOPIC
Bacteria, UA: NONE SEEN
Bilirubin Urine: NEGATIVE
Glucose, UA: NEGATIVE mg/dL
Ketones, ur: NEGATIVE mg/dL
Leukocytes,Ua: NEGATIVE
Nitrite: NEGATIVE
Protein, ur: NEGATIVE mg/dL
Specific Gravity, Urine: 1.006 (ref 1.005–1.030)
pH: 7 (ref 5.0–8.0)

## 2020-10-03 LAB — CBC
HCT: 39.8 % (ref 36.0–46.0)
Hemoglobin: 12.8 g/dL (ref 12.0–15.0)
MCH: 29.2 pg (ref 26.0–34.0)
MCHC: 32.2 g/dL (ref 30.0–36.0)
MCV: 90.7 fL (ref 80.0–100.0)
Platelets: 298 10*3/uL (ref 150–400)
RBC: 4.39 MIL/uL (ref 3.87–5.11)
RDW: 12.7 % (ref 11.5–15.5)
WBC: 11 10*3/uL — ABNORMAL HIGH (ref 4.0–10.5)
nRBC: 0 % (ref 0.0–0.2)

## 2020-10-03 LAB — I-STAT BETA HCG BLOOD, ED (MC, WL, AP ONLY): I-stat hCG, quantitative: <5 m[IU]/mL (ref <5)

## 2020-10-03 LAB — LIPASE, BLOOD: Lipase: 33 U/L (ref 11–51)

## 2020-10-03 NOTE — ED Notes (Signed)
Pt got some Toradol for pain at UC is refusing to have pain medication during triage.

## 2020-10-03 NOTE — ED Triage Notes (Signed)
Pt c/o lower abd pain since last night getting worse today.

## 2020-10-04 ENCOUNTER — Emergency Department (HOSPITAL_COMMUNITY): Payer: BC Managed Care – PPO

## 2020-10-04 MED ORDER — AMOXICILLIN-POT CLAVULANATE 875-125 MG PO TABS: 1.0000 | ORAL_TABLET | Freq: Three times a day (TID) | ORAL | 0 refills | Status: AC

## 2020-10-04 MED ORDER — SODIUM CHLORIDE 0.9 % IV BOLUS
1000.0000 mL | Freq: Once | INTRAVENOUS | Status: AC
  Administered 2020-10-04: 1000 mL via INTRAVENOUS

## 2020-10-04 MED ORDER — ONDANSETRON 4 MG PO TBDP: 4.0000 mg | ORAL_TABLET | Freq: Three times a day (TID) | ORAL | 0 refills | Status: DC | PRN

## 2020-10-04 MED ORDER — FENTANYL CITRATE (PF) 100 MCG/2ML IJ SOLN
25.0000 ug | Freq: Once | INTRAMUSCULAR | Status: AC
  Administered 2020-10-04: 25 ug via INTRAVENOUS
  Filled 2020-10-04: qty 2

## 2020-10-04 MED ORDER — OXYCODONE HCL 5 MG PO TABS: 5.0000 mg | ORAL_TABLET | ORAL | 0 refills | Status: DC | PRN

## 2020-10-04 MED ORDER — ONDANSETRON HCL 4 MG/2ML IJ SOLN
4.0000 mg | Freq: Once | INTRAMUSCULAR | Status: AC
  Administered 2020-10-04: 4 mg via INTRAVENOUS
  Filled 2020-10-04: qty 2

## 2020-10-04 MED ORDER — DICYCLOMINE HCL 20 MG PO TABS: 20.0000 mg | ORAL_TABLET | Freq: Two times a day (BID) | ORAL | 0 refills | Status: DC

## 2020-10-04 MED ORDER — IOHEXOL 300 MG/ML  SOLN
100.0000 mL | Freq: Once | INTRAMUSCULAR | Status: AC | PRN
  Administered 2020-10-04: 100 mL via INTRAVENOUS

## 2020-10-04 NOTE — Discharge Instructions (Addendum)
Take Tylenol 1000 mg 4 times a day for 1 week. This is the maximum dose of Tylenol (acetaminophen) you can take from all sources. Please check other over-the-counter medications and prescriptions to ensure you are not taking other medications that contain acetaminophen.  You may also take ibuprofen 400 mg 6 times a day alternating with or at the same time as tylenol.  We have also prescribed you bentyl for pain relief.  Take oxycodone as needed for breakthrough pain.  This medication can be addicting, sedating and cause constipation.

## 2020-10-04 NOTE — ED Provider Notes (Signed)
[facility] EMERGENCY DEPARTMENT Provider Note   CSN: [id number] Arrival date & time: 10/03/20  4401     History Chief Complaint  Patient presents with  . Abdominal Pain    Natasha Watson is a 39 y.o. female.  HPI     Lower pelvis pain and swelling Began feeling crampy/achy in pelvis Yesterday AM was more achy, then continued to worsen until 10AM yesterday couldn't walk due to severity of pain Went to urgent care Had normal BM, 64oz of water a day, stays hydrated No back pain Had ovarian cyst 5 years ago, similar but that was over quickly Severe pain 10/10 IUD, just had annual check up with Dr. Garwin Brothers Has not been sexually active, does not typically get cramping with periods No vaginal discharge/bleeding, dysuria, vomiting Nausea with severe pain, has not eaten No fevers  Past Medical History:  Diagnosis Date  . Attempted suicide ([place]) 10/2003  . Bulimia    HISTORY OF  . Hx of varicella   . Polyhydramnios   . Postpartum care following cesarean delivery (1/30) 12/05/2013  . Rubella immune 2009  . Seizure disorder ([place]) 03/2004   DR. CARMEN DOHMEIER FOLLOWS HER FOR THIS.  . Seizures ([place])    Last seizure 2013    Patient Active Problem List   Diagnosis Date Noted  . Labor and delivery, indication for care 10/12/2016  . Postpartum care following cesarean delivery (12/8) 12/05/2013  . Seizure disorder [facility])     Past Surgical History:  Procedure Laterality Date  . CESAREAN SECTION N/A 12/04/2013   Procedure: CESAREAN SECTION;  Surgeon: Marvene Staff, MD;  Location: [facility];  Service: Obstetrics;  Laterality: N/A;  . CESAREAN SECTION N/A 10/12/2016   Procedure: CESAREAN SECTION;  Surgeon: Servando Salina, MD;  Location: [place];  Service: Obstetrics;  Laterality: N/A;  . WISDOM TOOTH EXTRACTION  06/2004     OB History    Gravida  2   Para  2   Term  2   Preterm      AB      Living  2     SAB      TAB       Ectopic      Multiple  0   Live Births  2           Family History  Problem Relation Age of Onset  . Breast cancer Mother 99  . Breast cancer Paternal Aunt   . Cancer Paternal Aunt        OVARIAN OR UTERINE  . Breast cancer Maternal Grandmother   . Heart disease Maternal Grandfather   . Hyperlipidemia Maternal Grandfather     Social History   Tobacco Use  . Smoking status: Former Smoker    Quit date: 10/10/2001    Years since quitting: 18.9  . Smokeless tobacco: Never Used  Substance Use Topics  . Alcohol use: Yes    Comment: rarely  . Drug use: No    Home Medications Prior to Admission medications   Medication Sig Start Date End Date Taking? Authorizing Provider  amoxicillin-clavulanate (AUGMENTIN) 875-125 MG tablet Take 1 tablet by mouth in the morning, at noon, and at bedtime for 10 days. 10/04/20 10/14/20  Gareth Morgan, MD  azithromycin (ZITHROMAX) 250 MG tablet Take two tablets on the first day and then one tablet every day after. 12/19/16   [provider]  dicyclomine (BENTYL) 20 MG tablet Take 1 tablet (20 mg total)  by mouth 2 (two) times daily. 10/04/20   Gareth Morgan, MD  FOLIC ACID PO Take 1 tablet by mouth at bedtime.    [provider]  HYDROmorphone (DILAUDID) 4 MG tablet Take 1 tablet (4 mg total) by mouth every 4 (four) hours as needed for severe pain. Patient not taking: Reported on 12/22/2016 10/14/16   Servando Salina, MD  ibuprofen (ADVIL,MOTRIN) 600 MG tablet Take 1 tablet (600 mg total) by mouth every 6 (six) hours. Patient not taking: Reported on 12/22/2016 10/14/16   Servando Salina, MD  lamoTRIgine (LAMICTAL) 100 MG tablet Take 300 mg by mouth at bedtime. 04/30/16   [provider]  ondansetron (ZOFRAN ODT) 4 MG disintegrating tablet Take 1 tablet (4 mg total) by mouth every 8 (eight) hours as needed for nausea or vomiting. 10/04/20   Gareth Morgan, MD  oxyCODONE (ROXICODONE) 5 MG immediate release  tablet Take 1 tablet (5 mg total) by mouth every 4 (four) hours as needed for severe pain. 10/04/20   Gareth Morgan, MD  Prenatal Vit-Fe Fumarate-FA (PRENATAL MULTIVITAMIN) TABS tablet Take 1 tablet by mouth daily at 12 noon.    [provider]    Allergies    Darvocet [propoxyphene n-acetaminophen] and Propoxyphene  Review of Systems   Review of Systems  Constitutional: Positive for appetite change. Negative for fever.  HENT: Negative for congestion.   Respiratory: Negative for shortness of breath.   Cardiovascular: Negative for chest pain.  Gastrointestinal: Positive for abdominal pain and nausea. Negative for constipation, diarrhea and vomiting.  Genitourinary: Negative for difficulty urinating, dysuria, vaginal bleeding and vaginal discharge.  Musculoskeletal: Negative for back pain.  Skin: Negative for rash.  Neurological: Negative for headaches.    Physical Exam Updated Vital Signs BP 108/66   Pulse 89   Temp 98.6 F (37 C) (Oral)   Resp 16   Ht 5' (1.524 m)   Wt 64.9 kg   SpO2 100%   BMI 27.94 kg/m   Physical Exam Vitals and nursing note reviewed.  Constitutional:      General: She is not in acute distress.    Appearance: She is well-developed. She is not diaphoretic.  HENT:     Head: Normocephalic and atraumatic.  Eyes:     Conjunctiva/sclera: Conjunctivae normal.  Cardiovascular:     Rate and Rhythm: Normal rate and regular rhythm.  Pulmonary:     Effort: Pulmonary effort is normal. No respiratory distress.  Abdominal:     General: There is no distension.     Palpations: Abdomen is soft.     Tenderness: There is abdominal tenderness in the left upper quadrant and left lower quadrant. There is left CVA tenderness. There is no guarding.  Musculoskeletal:        General: No tenderness.     Cervical back: Normal range of motion.  Skin:    General: Skin is warm and dry.     Findings: No erythema or rash.  Neurological:     Mental Status: She is  alert and oriented to person, place, and time.     ED Results / Procedures / Treatments   Labs (all labs ordered are listed, but only abnormal results are displayed) Labs Reviewed  CBC - Abnormal; Notable for the following components:      Result Value   WBC 11.0 (*)    All other components within normal limits  URINALYSIS, ROUTINE W REFLEX MICROSCOPIC - Abnormal; Notable for the following components:   Color, Urine STRAW (*)  Hgb urine dipstick SMALL (*)    All other components within normal limits  LIPASE, BLOOD  COMPREHENSIVE METABOLIC PANEL  I-STAT BETA HCG BLOOD, ED ([facility], [facility], [facility] ONLY)    EKG None  Radiology CT ABDOMEN PELVIS W CONTRAST  Result Date: 10/04/2020 CLINICAL DATA:  39 year old female presenting with lower abdominal pain. Diverticulitis suspected. EXAM: CT ABDOMEN AND PELVIS WITH CONTRAST TECHNIQUE: Multidetector CT imaging of the abdomen and pelvis was performed using the standard protocol following bolus administration of intravenous contrast. CONTRAST:  133mL OMNIPAQUE IOHEXOL 300 MG/ML  SOLN COMPARISON:  None. FINDINGS: Lower chest: No acute abnormality. Hepatobiliary: No focal liver abnormality is seen. No gallstones, gallbladder wall thickening, or biliary dilatation. Pancreas: Unremarkable. No pancreatic ductal dilatation or surrounding inflammatory changes. Spleen: Normal in size without focal abnormality. Adrenals/Urinary Tract: Adrenal glands are unremarkable. Kidneys are normal, without renal calculi, focal lesion, or hydronephrosis. Bladder is unremarkable. Stomach/Bowel: Stomach is within normal limits. Course and caliber of the small appendix appears normal. Scattered descending colon and sigmoid diverticula. There is a focal region of mural thickening and pericolonic fat stranding with trace fluid at the distal aspect of the descending colon/proximal sigmoid colon. Vascular/Lymphatic: No significant vascular findings are present. No enlarged abdominal or  pelvic lymph nodes. Reproductive: Appropriately positioned IUD in place in otherwise normal appearing uterus. No adnexal masses. Other: Trace, non organized fluid surrounding the distal descending colon. No additional ascites. Mild Peri umbilical diastasis recti. Musculoskeletal: No acute or significant osseous findings. IMPRESSION: Acute, uncomplicated diverticulitis affecting the descending/sigmoid colon junction. Ruthann Cancer, MD [facility] Radiology Electronically Signed   By: Ruthann Cancer MD   On: 10/04/2020 09:23    Procedures Procedures (including critical care time)  Medications Ordered in ED Medications  sodium chloride 0.9 % bolus 1,000 mL (0 mLs Intravenous Stopped 10/04/20 0923)  fentaNYL (SUBLIMAZE) injection 25 mcg (25 mcg Intravenous Given 10/04/20 0752)  ondansetron (ZOFRAN) injection 4 mg (4 mg Intravenous Given 10/04/20 0752)  iohexol (OMNIPAQUE) 300 MG/ML solution 100 mL (100 mLs Intravenous Contrast Given 10/04/20 0913)    ED Course  I have reviewed the triage vital signs and the nursing notes.  Pertinent labs & imaging results that were available during my care of the patient were reviewed by me and considered in my medical decision making (see chart for details).    MDM Rules/Calculators/A&P                          40 year old female with history of seizure disorder, presents with concern for lower abdominal pain. DDx includes appendicitis, pancreatitis, cholecystitis, pyelonephritis, nephrolithiasis, diverticulitis, PID, ovarian torsion, ectopic pregnancy, and tuboovarian abscess.  Pregnancy test negative.  Urinalysis without signs of urinary tract infection.   CT completed given left-sided tenderness on exam shows acute uncomplicated diverticulitis.  Given prescription for Augmentin, Zofran, Bentyl, with recommendation of Tylenol and ibuprofen for primary pain relief and given prescription for 10 tablets of oxycodone  for breakthrough pain after review of the [place] drug database.  Recommend PCP follow up. Patient discharged in stable condition with understanding of reasons to return.    Final Clinical Impression(s) / ED Diagnoses Final diagnoses:  Diverticulitis    Rx / DC Orders ED Discharge Orders         Ordered    amoxicillin-clavulanate (AUGMENTIN) 875-125 MG tablet  3 times daily        10/04/20 0945    dicyclomine (BENTYL)  20 MG tablet  2 times daily        10/04/20 0945    ondansetron (ZOFRAN ODT) 4 MG disintegrating tablet  Every 8 hours PRN        10/04/20 0945    oxyCODONE (ROXICODONE) 5 MG immediate release tablet  Every 4 hours PRN        10/04/20 0945           Gareth Morgan, MD 10/04/20 [phone]

## 2020-12-14 ENCOUNTER — Telehealth: Payer: Self-pay | Admitting: Internal Medicine

## 2020-12-14 NOTE — Telephone Encounter (Addendum)
Contacted by father at her request, he and I are friends.  The patient had a gastroenterology visit with Dr. Collene Mares yesterday and was dissatisfied and would like to have her care elsewhere.  I will contact her and arrange an appointment.  Issues are:  Recent diverticulitis  Vitamin D deficiency question underlying malabsorptive issue  Needs appointment try for this month  OK to use f/u, or add 1130 or 350

## 2020-12-15 NOTE — Telephone Encounter (Signed)
Patient is scheduled for 2/17 at 11:30

## 2020-12-22 ENCOUNTER — Ambulatory Visit: Payer: 59 | Admitting: Internal Medicine

## 2020-12-22 ENCOUNTER — Encounter: Payer: Self-pay | Admitting: Internal Medicine

## 2020-12-22 VITALS — BP 108/64 | HR 82 | Ht 61.0 in | Wt 131.0 lb

## 2020-12-22 DIAGNOSIS — R194 Change in bowel habit: Secondary | ICD-10-CM

## 2020-12-22 DIAGNOSIS — E559 Vitamin D deficiency, unspecified: Secondary | ICD-10-CM | POA: Diagnosis not present

## 2020-12-22 DIAGNOSIS — K5732 Diverticulitis of large intestine without perforation or abscess without bleeding: Secondary | ICD-10-CM

## 2020-12-22 NOTE — Patient Instructions (Addendum)
Normal BMI (Body Mass Index- based on height and weight) is between 19 and 25. Your BMI today is Body mass index is 24.75 kg/m. Marland Kitchen Please consider follow up  regarding your BMI with your Primary Care Provider.  You have been scheduled for a colonoscopy. Please follow written instructions given to you at your visit today.  Please pick up your prep supplies at the pharmacy within the next 1-3 days. If you use inhalers (even only as needed), please bring them with you on the day of your procedure.  We will get your records for review.  I appreciate the opportunity to care for you. Silvano Rusk, MD, [facility]

## 2020-12-22 NOTE — Progress Notes (Signed)
Natasha Watson 40 y.o. 1981/09/15 [id number]  Assessment & Plan:   Encounter Diagnoses  Name Primary?   Diverticulitis of colon Yes   Altered bowel habits    Vitamin D deficiency     It is appropriate that she have a colonoscopy to evaluate for polypoid lesion or other potential trigger for diverticulitis.  This is incredibly rare but it is a standard of care to do so.  She still has some residual GI symptoms as well.  They are mild.  We talked about the possibility of checking for celiac disease but have deferred that for now.  I want to review the labs that Dr. Collene Mares has done and Dr. Garwin Brothers.  Vitamin D deficiency is not uncommon, tickly in the winter labs or levels of that can be low.  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  I appreciate the opportunity to care for this patient. CC: Servando Salina MD   Subjective:   Chief Complaint:  HPI Patient is a 40 year old white woman who suffered an episode of diverticulitis in November 2021.  She gives a story of having had amitriptyline started by her neurologist because of lack of sleep and increased anxiety.  She was following up due to a breakthrough seizure thought due to insomnia last year.  The amitriptyline gave her quite a bit of constipation and she went from having 1 or 2 stools a day to 1 every 3 to 4 days.  Despite large amounts of water and laxatives she was not any better.  She was changed from amitriptyline to Topamax.  Bowel symptoms improved somewhat however she had a period where she was having fairly severe left lower quadrant pain and was evaluated by the emergency department  at [facility] and had a CT scan that demonstrated the following:  Narrative & Impression  CLINICAL DATA:  40 year old female presenting with lower abdominal pain. Diverticulitis suspected.  EXAM: CT ABDOMEN AND PELVIS WITH  CONTRAST  TECHNIQUE: Multidetector CT imaging of the abdomen and pelvis was performed using the standard protocol following bolus administration of intravenous contrast.  CONTRAST:  141mL OMNIPAQUE IOHEXOL 300 MG/ML  SOLN  COMPARISON:  None.  FINDINGS: Lower chest: No acute abnormality.  Hepatobiliary: No focal liver abnormality is seen. No gallstones, gallbladder wall thickening, or biliary dilatation.  Pancreas: Unremarkable. No pancreatic ductal dilatation or surrounding inflammatory changes.  Spleen: Normal in size without focal abnormality.  Adrenals/Urinary Tract: Adrenal glands are unremarkable. Kidneys are normal, without renal calculi, focal lesion, or hydronephrosis. Bladder is unremarkable.  Stomach/Bowel: Stomach is within normal limits. Course and caliber of the small appendix appears normal. Scattered descending colon and sigmoid diverticula. There is a focal region of mural thickening and pericolonic fat stranding with trace fluid at the distal aspect of the descending colon/proximal sigmoid colon.  Vascular/Lymphatic: No significant vascular findings are present. No enlarged abdominal or pelvic lymph nodes.  Reproductive: Appropriately positioned IUD in place in otherwise normal appearing uterus. No adnexal masses.  Other: Trace, non organized fluid surrounding the distal descending colon. No additional ascites. Mild Peri umbilical diastasis recti.  Musculoskeletal: No acute or significant osseous findings.  IMPRESSION: Acute, uncomplicated diverticulitis affecting the descending/sigmoid colon junction.  Ruthann Cancer, MD  [facility]  [facility]   Electronically Signed   By: Ruthann Cancer MD   On: 10/04/2020 09:23   She was treated with antibiotics, generic  Augmentin.  She recovered but her bowel movements still were irregular somewhat.  However over time they have improved but  she was having quite a bit of fatigue in December and January.  She saw Dr. Garwin Brothers in follow-up and her vitamin D level was "very low".  A pelvic ultrasound was okay per the patient.  She was having some breakthrough bleeding in January as well and some back pain and constipation and gas then.  Dr. Garwin Brothers thought GI evaluation was appropriate and referred her to Dr. Collene Mares.  The patient saw Dr. Collene Mares but preferred to seek another gastroenterologist.  Her father is a friend and he reached out and we arrange this appointment.  Vitamin D supplementation has been started.  She had labs performed at Dr. Lorie Apley office as well.  There is a family history of colon polyps in her mother but no first-degree relatives have colon cancer.  There may be some colon cancer in other more distant relatives.   GI review of systems otherwise negative  I reviewed the ER note and the labs.  Normal CBC at that time except that white count was mildly elevated at 11 Allergies  Allergen Reactions   Darvocet [Propoxyphene N-Acetaminophen] Nausea And Vomiting   Current Meds  Medication Sig   ibuprofen (ADVIL,MOTRIN) 600 MG tablet Take 1 tablet (600 mg total) by mouth every 6 (six) hours.   lamoTRIgine (LAMICTAL) 100 MG tablet Take 300 mg by mouth at bedtime.   topiramate (TOPAMAX) 25 MG tablet    Vitamin D, Ergocalciferol, (DRISDOL) 1.25 MG (50000 UNIT) CAPS capsule Take 50,000 Units by mouth once a week.   Past Medical History:  Diagnosis Date   Attempted suicide ([place]) 10/2003   Bulimia    HISTORY OF   Diverticulitis    Hx of varicella    Melanoma ([place])    Polyhydramnios    Postpartum care following cesarean delivery (1/30) 12/05/2013   Rubella immune 2009   Seizure disorder ([place]) 03/2004   [facility] in [place]   Seizures [facility])    Last seizure 01/2020   Vitamin D deficiency    Past Surgical History:  Procedure Laterality Date   CESAREAN SECTION N/A 12/04/2013   Procedure: CESAREAN  SECTION;  Surgeon: Marvene Staff, MD;  Location: [facility];  Service: Obstetrics;  Laterality: N/A;   CESAREAN SECTION N/A 10/12/2016   Procedure: CESAREAN SECTION;  Surgeon: Servando Salina, MD;  Location: [facility];  Service: Obstetrics;  Laterality: N/A;   WISDOM TOOTH EXTRACTION  06/2004   Social History   Social History Narrative   Married   Admin assist   2 sons, 2015 and 2017   rare EtOH, formr smoker, no drugs   family history includes Breast cancer in her maternal grandmother and paternal aunt; Breast cancer (age of onset: 43) in her mother; Cancer in her paternal aunt; Colon polyps in her mother; Heart disease in her maternal grandfather; Hyperlipidemia in her maternal grandfather.   Review of Systems As per HPI all other review systems are negative  Objective:   Physical Exam BP 108/64    Pulse 82    Ht 5\' 1"  (1.549 m)    Wt 131 lb (59.4 kg)    BMI 24.75 kg/m  Well-developed well-nourished white woman in no acute distress Eyes anicteric Lungs are clear S1-S2 no rubs murmurs or gallops are heard on heart exam Abdomen is soft nontender without any organo-megaly or mass bowel sounds present no bruits Rectal exam is deferred  until colonoscopy She is alert and oriented x3 and has an appropriate mood and affect   Data reviewed/requested as per HPI and assessment and plan

## 2021-02-03 HISTORY — PX: COLONOSCOPY: SHX174

## 2021-02-13 ENCOUNTER — Encounter: Payer: Self-pay | Admitting: Internal Medicine

## 2021-02-13 ENCOUNTER — Ambulatory Visit (AMBULATORY_SURGERY_CENTER): Payer: 59 | Admitting: Internal Medicine

## 2021-02-13 ENCOUNTER — Other Ambulatory Visit: Payer: Self-pay

## 2021-02-13 VITALS — BP 97/52 | HR 74 | Temp 98.4°F | Resp 12 | Ht 61.0 in | Wt 131.0 lb

## 2021-02-13 DIAGNOSIS — K5732 Diverticulitis of large intestine without perforation or abscess without bleeding: Secondary | ICD-10-CM

## 2021-02-13 MED ORDER — SODIUM CHLORIDE 0.9 % IV SOLN: 500.0000 mL | Freq: Once | INTRAVENOUS | Status: DC

## 2021-02-13 NOTE — Progress Notes (Signed)
PT taken to PACU. Monitors in place. VSS. Report given to RN. 

## 2021-02-13 NOTE — Patient Instructions (Addendum)
I saw diverticulosis as expected. No polyps or cancer seen.  Next routine colonoscopy or other screening test in 10 years - 2032.  I hope you do not have more trouble with diverticulitis but you may.  Please contact me if you think you are having signs and symptoms of that again - I can usually prescribe medication by phone for that if you think it is same thing. And then would arrange a follow-up visit.  I appreciate the opportunity to care for you. Gatha Mayer, MD, FACG  YOU HAD AN ENDOSCOPIC PROCEDURE TODAY AT [facility]:   Refer to the procedure report that was given to you for any specific questions about what was found during the examination.  If the procedure report does not answer your questions, please call your gastroenterologist to clarify.  If you requested that your care partner not be given the details of your procedure findings, then the procedure report has been included in a sealed envelope for you to review at your convenience later.  YOU SHOULD EXPECT: Some feelings of bloating in the abdomen. Passage of more gas than usual.  Walking can help get rid of the air that was put into your GI tract during the procedure and reduce the bloating. If you had a lower endoscopy (such as a colonoscopy or flexible sigmoidoscopy) you may notice spotting of blood in your stool or on the toilet paper. If you underwent a bowel prep for your procedure, you may not have a normal bowel movement for a few days.  Please Note:  You might notice some irritation and congestion in your nose or some drainage.  This is from the oxygen used during your procedure.  There is no need for concern and it should clear up in a day or so.  SYMPTOMS TO REPORT IMMEDIATELY:   Following lower endoscopy (colonoscopy or flexible sigmoidoscopy):  Excessive amounts of blood in the stool  Significant tenderness or worsening of abdominal pains  Swelling of the abdomen that is new, acute  Fever of  100F or higher   Following upper endoscopy (EGD)  Vomiting of blood or coffee ground material  New chest pain or pain under the shoulder blades  Painful or persistently difficult swallowing  New shortness of breath  Fever of 100F or higher  Black, tarry-looking stools  For urgent or emergent issues, a gastroenterologist can be reached at any hour by calling [phone]. Do not use MyChart messaging for urgent concerns.    DIET:  We do recommend a small meal at first, but then you may proceed to your regular diet.  Drink plenty of fluids but you should avoid alcoholic beverages for 24 hours.  ACTIVITY:  You should plan to take it easy for the rest of today and you should NOT DRIVE or use heavy machinery until tomorrow (because of the sedation medicines used during the test).    FOLLOW UP: Our staff will call the number listed on your records 48-72 hours following your procedure to check on you and address any questions or concerns that you may have regarding the information given to you following your procedure. If we do not reach you, we will leave a message.  We will attempt to reach you two times.  During this call, we will ask if you have developed any symptoms of COVID 19. If you develop any symptoms (ie: fever, flu-like symptoms, shortness of breath, cough etc.) before then, please call [phone].  If you test  positive for Covid 19 in the 2 weeks post procedure, please call and report this information to [place].    If any biopsies were taken you will be contacted by phone or by letter within the next 1-3 weeks.  Please call us at [phone] if you have not heard about the biopsies in 3 weeks.    SIGNATURES/CONFIDENTIALITY: You and/or your care partner have signed paperwork which will be entered into your electronic medical record.  These signatures attest to the fact that that the information above on your After Visit Summary has been reviewed and is understood.  Full  responsibility of the confidentiality of this discharge information lies with you and/or your care-partner.

## 2021-02-13 NOTE — Op Note (Signed)
[facility] Patient Name: Natasha Watson Procedure Date: 02/13/2021 1:48 PM MRN: [id number] Endoscopist: Gatha Mayer , MD Age: 40 Referring MD:  Date of Birth: Jan 21, 1981 Gender: Female Account #: [id number] Procedure:                Colonoscopy Indications:              Diverticulitis, Follow-up of diverticulitis Medicines:                Propofol per Anesthesia, Monitored Anesthesia Care Procedure:                Pre-Anesthesia Assessment:                           - Prior to the procedure, a History and Physical                            was performed, and patient medications and                            allergies were reviewed. The patient's tolerance of                            previous anesthesia was also reviewed. The risks                            and benefits of the procedure and the sedation                            options and risks were discussed with the patient.                            All questions were answered, and informed consent                            was obtained. Prior Anticoagulants: The patient has                            taken no previous anticoagulant or antiplatelet                            agents. ASA Grade Assessment: II - A patient with                            mild systemic disease. After reviewing the risks                            and benefits, the patient was deemed in                            satisfactory condition to undergo the procedure.                           After obtaining informed consent, the colonoscope  was passed under direct vision. Throughout the                            procedure, the patient's blood pressure, pulse, and                            oxygen saturations were monitored continuously. The                            Olympus [id number]-[id number] (#[id number]) Colonoscope was                            introduced through the anus and advanced to the the                             cecum, identified by appendiceal orifice and                            ileocecal valve. The colonoscopy was performed                            without difficulty. The patient tolerated the                            procedure well. The quality of the bowel                            preparation was good. The ileocecal valve,                            appendiceal orifice, and rectum were photographed.                            The bowel preparation used was Miralax via split                            dose instruction. Scope In: 2:03:23 PM Scope Out: 2:14:29 PM Scope Withdrawal Time: 0 hours 8 minutes 19 seconds  Total Procedure Duration: 0 hours 11 minutes 6 seconds  Findings:                 The perianal and digital rectal examinations were                            normal.                           Multiple small and large-mouthed diverticula were                            found in the sigmoid colon, descending colon,                            ascending colon and cecum. There was no evidence of  diverticular bleeding.                           The exam was otherwise without abnormality on                            direct and retroflexion views. Complications:            No immediate complications. Estimated Blood Loss:     Estimated blood loss: none. Impression:               - Moderate diverticulosis in the sigmoid colon, in                            the descending colon, in the ascending colon and in                            the cecum. There was no evidence of diverticular                            bleeding.                           - The examination was otherwise normal on direct                            and retroflexion views.                           - No specimens collected. Recommendation:           - Patient has a contact number available for                            emergencies. The signs and symptoms of potential                             delayed complications were discussed with the                            patient. Return to normal activities tomorrow.                            Written discharge instructions were provided to the                            patient.                           - Continue present medications.                           - Repeat colonoscopy in 10 years for screening                            purposes.                           -  Resume previous diet [Duration]. Gatha Mayer, MD 02/13/2021 2:23:17 PM This report has been signed electronically.

## 2021-02-15 ENCOUNTER — Telehealth: Payer: Self-pay | Admitting: *Deleted

## 2021-02-15 NOTE — Telephone Encounter (Signed)
  Follow up Call-  Call back number 02/13/2021  Post procedure Call Back phone  # [phone]  Permission to leave phone message Yes  Some recent data might be hidden     Patient questions:  Do you have a fever, pain , or abdominal swelling? No. Pain Score  0 *  Have you tolerated food without any problems? Yes.    Have you been able to return to your normal activities? Yes.    Do you have any questions about your discharge instructions: Diet   No. Medications  No. Follow up visit  No.  Do you have questions or concerns about your Care? No.  Actions: * If pain score is 4 or above: No action needed, pain <4. 1. Have you developed a fever since your procedure? no  2.   Have you had an respiratory symptoms (SOB or cough) since your procedure? no  3.   Have you tested positive for COVID 19 since your procedure no  4.   Have you had any family members/close contacts diagnosed with the COVID 19 since your procedure?  no   If yes to any of these questions please route to Joylene John, RN and Joella Prince, RN

## 2021-09-27 IMAGING — CT CT ABD-PELV W/ CM
2 of 4 series · 16 of 46 positions shown, 18 images · IV contrast (omnipaque)
Comparison: None.

CLINICAL DATA: 39-year-old female presenting with lower abdominal
pain. Diverticulitis suspected.

EXAM:
CT ABDOMEN AND PELVIS WITH CONTRAST
TECHNIQUE: Multidetector CT imaging of the abdomen and pelvis was performed
using the standard protocol following bolus administration of
intravenous contrast.
CONTRAST:  100mL OMNIPAQUE IOHEXOL 300 MG/ML  SOLN

[Series 4: a/p w/ 5mm · axial · 0.69mm/px · z∈[+846,+1236]mm · 13 of 86 slices shown, 15 images]
[im 4/86  soft-tissue]
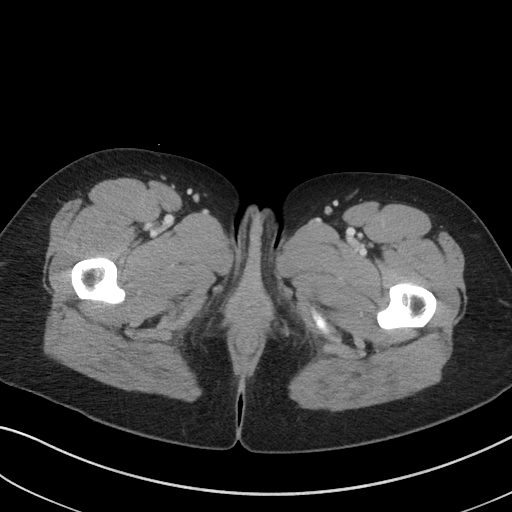
[im 4/86  bone]
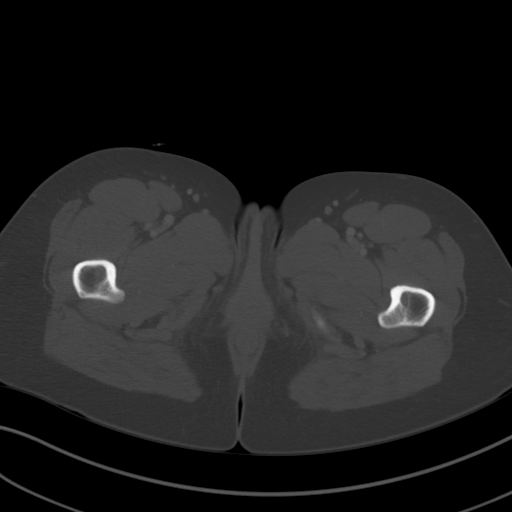
[im 10/86  soft-tissue]
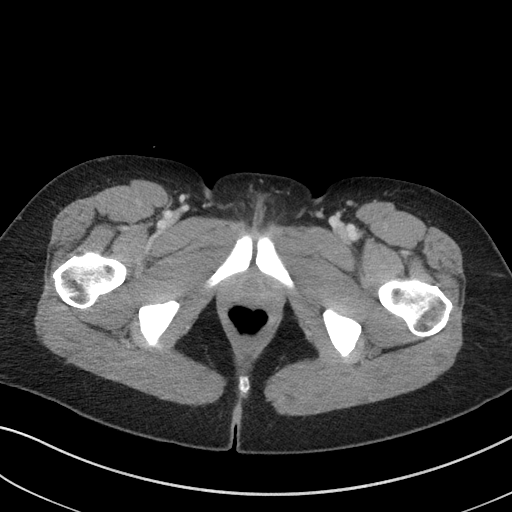
[im 17/86  soft-tissue]
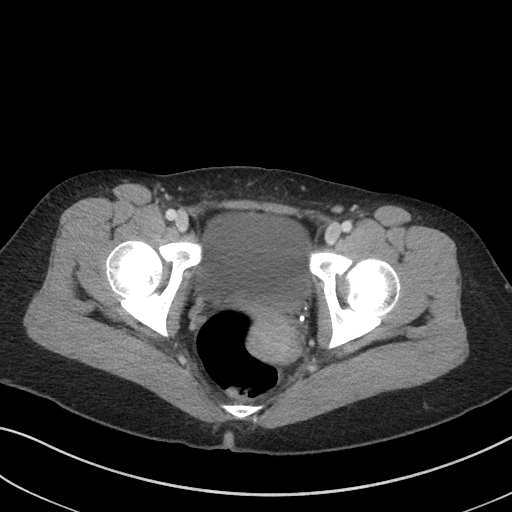
[im 23/86  soft-tissue]
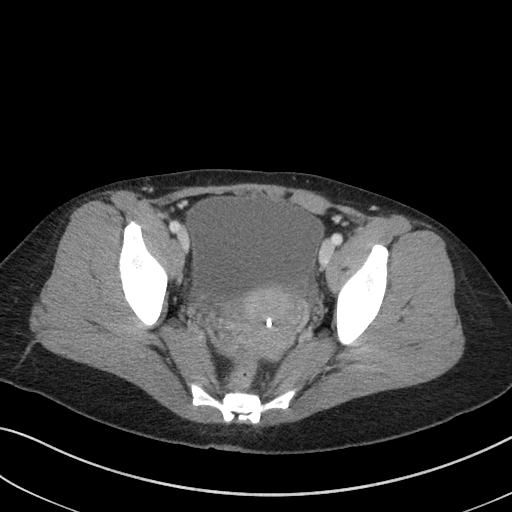
[im 30/86  soft-tissue]
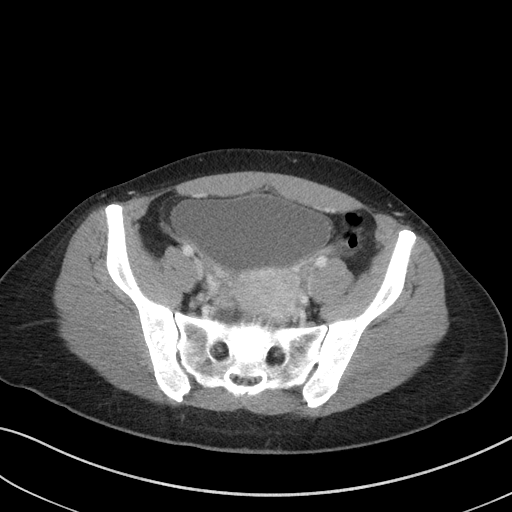
[im 36/86  soft-tissue]
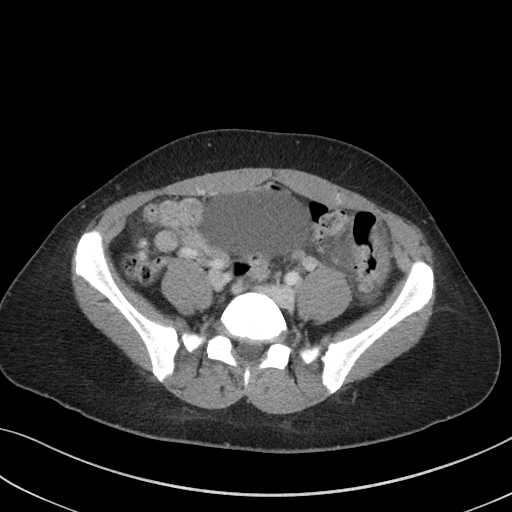
[im 43/86  soft-tissue]
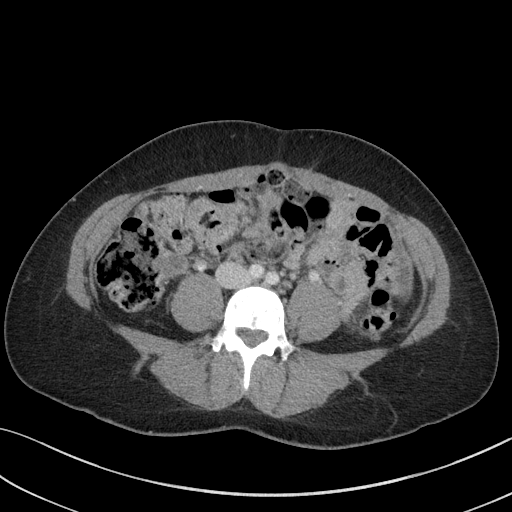
[im 50/86  soft-tissue]
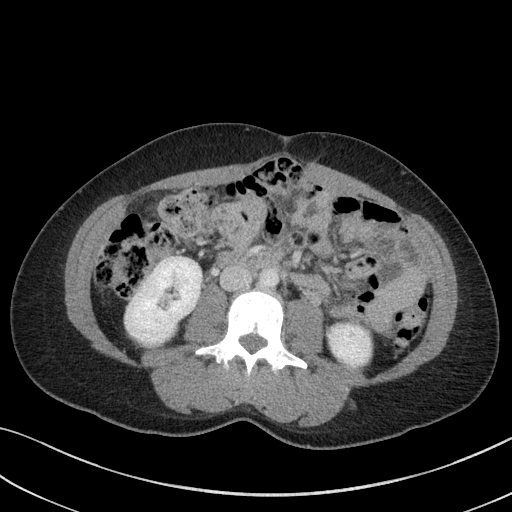
[im 56/86  soft-tissue]
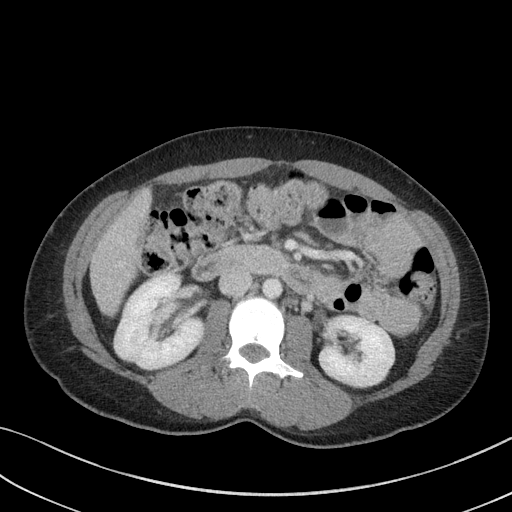
[im 56/86  bone]
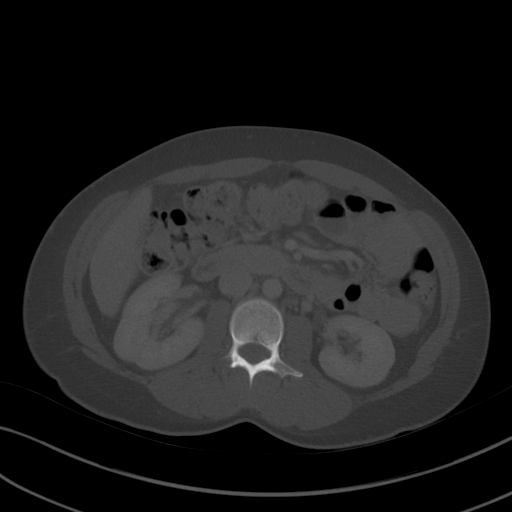
[im 63/86  soft-tissue]
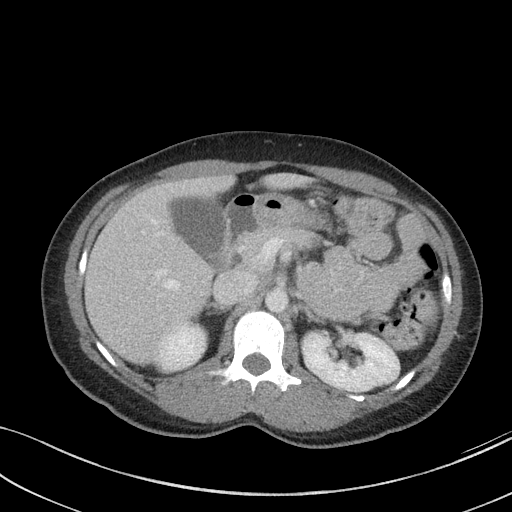
[im 69/86  soft-tissue]
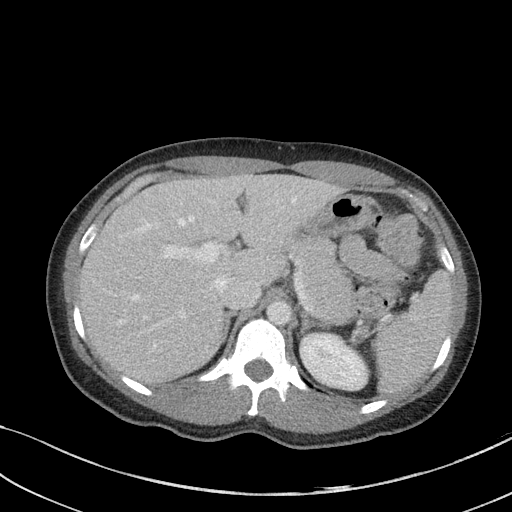
[im 76/86  soft-tissue]
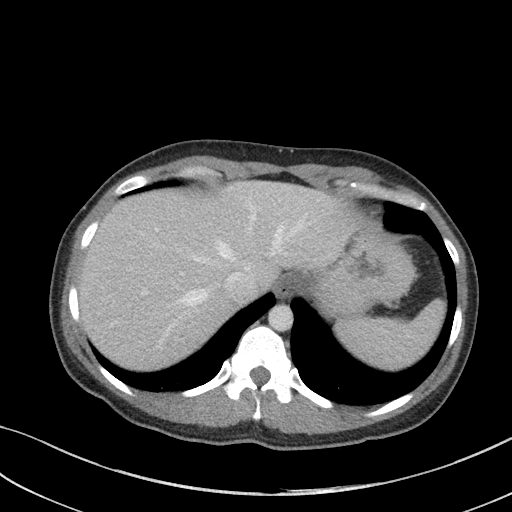
[im 82/86  soft-tissue]
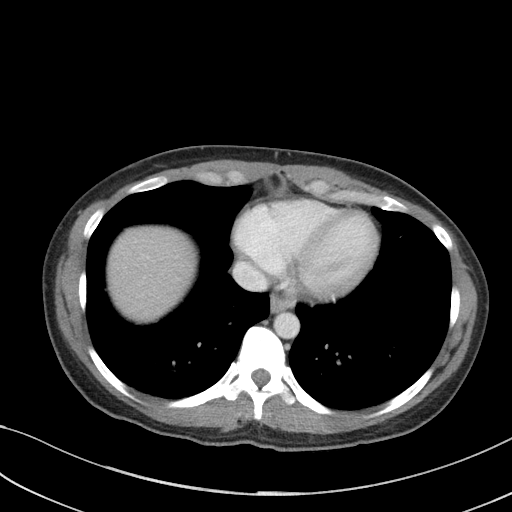

[Series 7: a/p w/ cor · coronal · 0.75mm/px · 3 of 126 slices shown]
[im 42/126  soft-tissue]
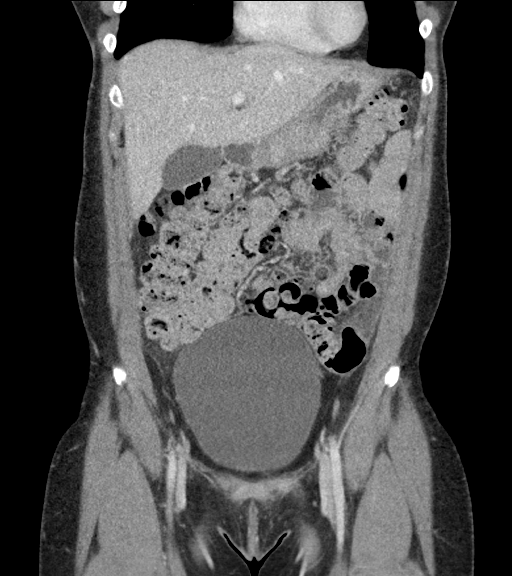
[im 56/126  soft-tissue]
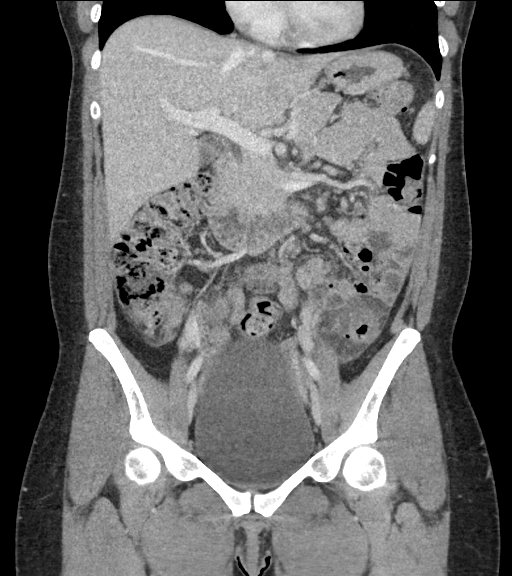
[im 70/126  soft-tissue]
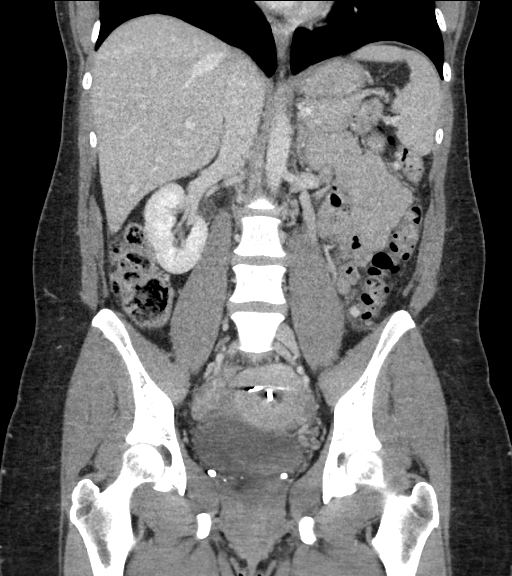

[16 of 46 positions shown; findings below may reference images not displayed]

FINDINGS: Lower chest: No acute abnormality.

Hepatobiliary: No focal liver abnormality is seen. No gallstones,
gallbladder wall thickening, or biliary dilatation.

Pancreas: Unremarkable. No pancreatic ductal dilatation or
surrounding inflammatory changes.

Spleen: Normal in size without focal abnormality.

Adrenals/Urinary Tract: Adrenal glands are unremarkable. Kidneys are
normal, without renal calculi, focal lesion, or hydronephrosis.
Bladder is unremarkable.

Stomach/Bowel: Stomach is within normal limits. Course and caliber
of the small appendix appears normal. Scattered descending colon and
sigmoid diverticula. There is a focal region of mural thickening and
pericolonic fat stranding with trace fluid at the distal aspect of
the descending colon/proximal sigmoid colon.

Vascular/Lymphatic: No significant vascular findings are present. No
enlarged abdominal or pelvic lymph nodes.

Reproductive: Appropriately positioned IUD in place in otherwise
normal appearing uterus. No adnexal masses.

Other: Trace, non organized fluid surrounding the distal descending
colon. No additional ascites. Mild Peri umbilical diastasis recti.

Musculoskeletal: No acute or significant osseous findings.
IMPRESSION: Acute, uncomplicated diverticulitis affecting the descending/sigmoid
colon junction.

## 2022-05-23 ENCOUNTER — Other Ambulatory Visit: Payer: Self-pay | Admitting: General Surgery

## 2022-05-23 DIAGNOSIS — Z803 Family history of malignant neoplasm of breast: Secondary | ICD-10-CM

## 2022-05-23 DIAGNOSIS — Z1239 Encounter for other screening for malignant neoplasm of breast: Secondary | ICD-10-CM

## 2022-06-07 ENCOUNTER — Ambulatory Visit
Admission: RE | Admit: 2022-06-07 | Discharge: 2022-06-07 | Disposition: A | Discharge status: Home / Self Care | Payer: 59 | Source: Ambulatory Visit | Attending: General Surgery | Admitting: General Surgery

## 2022-06-07 DIAGNOSIS — Z1239 Encounter for other screening for malignant neoplasm of breast: Secondary | ICD-10-CM

## 2022-06-07 DIAGNOSIS — Z803 Family history of malignant neoplasm of breast: Secondary | ICD-10-CM

## 2022-06-07 MED ORDER — GADOBUTROL 1 MMOL/ML IV SOLN
6.0000 mL | Freq: Once | INTRAVENOUS | Status: AC | PRN
  Administered 2022-06-07: 6 mL via INTRAVENOUS

## 2023-06-14 ENCOUNTER — Other Ambulatory Visit: Payer: Self-pay | Admitting: General Surgery

## 2023-06-14 DIAGNOSIS — Z803 Family history of malignant neoplasm of breast: Secondary | ICD-10-CM

## 2023-06-14 DIAGNOSIS — Z1239 Encounter for other screening for malignant neoplasm of breast: Secondary | ICD-10-CM

## 2023-07-10 ENCOUNTER — Encounter: Payer: Self-pay | Admitting: Internal Medicine

## 2023-07-10 ENCOUNTER — Ambulatory Visit: Payer: 59 | Admitting: Internal Medicine

## 2023-07-10 VITALS — BP 112/68 | HR 76 | Temp 98.4°F | Ht 61.0 in | Wt 126.4 lb

## 2023-07-10 DIAGNOSIS — Z23 Encounter for immunization: Secondary | ICD-10-CM

## 2023-07-10 DIAGNOSIS — Z83719 Family history of colon polyps, unspecified: Secondary | ICD-10-CM

## 2023-07-10 DIAGNOSIS — Z800 Family history of malignant neoplasm of digestive organs: Secondary | ICD-10-CM | POA: Diagnosis not present

## 2023-07-10 DIAGNOSIS — E162 Hypoglycemia, unspecified: Secondary | ICD-10-CM | POA: Diagnosis not present

## 2023-07-10 DIAGNOSIS — Z803 Family history of malignant neoplasm of breast: Secondary | ICD-10-CM | POA: Diagnosis not present

## 2023-07-10 DIAGNOSIS — G40109 Localization-related (focal) (partial) symptomatic epilepsy and epileptic syndromes with simple partial seizures, not intractable, without status epilepticus: Secondary | ICD-10-CM

## 2023-07-10 DIAGNOSIS — G4700 Insomnia, unspecified: Secondary | ICD-10-CM | POA: Insufficient documentation

## 2023-07-10 HISTORY — DX: Family history of colon polyps, unspecified: Z83.719

## 2023-07-10 HISTORY — DX: Family history of malignant neoplasm of digestive organs: Z80.0

## 2023-07-10 LAB — BASIC METABOLIC PANEL
BUN: 14 mg/dL (ref 6–23)
CO2: 26 meq/L (ref 19–32)
Calcium: 9.3 mg/dL (ref 8.4–10.5)
Chloride: 106 meq/L (ref 96–112)
Creatinine, Ser: 0.86 mg/dL (ref 0.40–1.20)
GFR: 83.3 mL/min (ref >60.00)
Glucose, Bld: 74 mg/dL (ref 70–99)
Potassium: 4.4 meq/L (ref 3.5–5.1)
Sodium: 138 meq/L (ref 135–145)

## 2023-07-10 LAB — TSH: TSH: 2.45 u[IU]/mL (ref 0.35–5.50)

## 2023-07-10 NOTE — Progress Notes (Signed)
[facility]IMARY [facility]ARE [facility] [address] [place] [address] Dept: [phone] Dept Fax: [phone]  New Patient Office Visit  Subjective:   Natasha Watson 42-23-1982 07/10/2023  Chief Complaint  Patient presents with   Establish Care    HPI: Natasha Watson presents today to establish care at [facility] at [facility]. Introduced to Publishing rights manager role and practice setting.  All questions answered.  Concerns: See below   Patient has a history of seizures, currently on lamotrigine 400mg  and Topamax 100mg  daily.  She is managed by [facility].  Last known seizure was in 2019 per patient.  She recently saw neurology on 06/18/2023 for follow-up visit. Is compliant with medications.   Lab work was conducted at the time of her neurology visit, which showed a low glucose level of 49. She was not fasting per patient.  She reports she was asymptomatic with the low glucose level.  Patient is concerned for hypoglycemia.  She eats frequent meals, high in protein, vegetables.  She stays well-hydrated.  Patient does have a strong family history of breast cancer (mother age 49, maternal grandmother in 22's, paternal aunt age 53, and paternal aunt ovarian cancer).  Patient reports she has done BRCA gene testing which was negative. She is followed by Dr. Dwain Sarna.  Had Breast MRI on 06/07/22 which was negative for malignancy. Per Dr. Doreen Salvage note on 06/17/23, it is reasonable for patient to do CEM and follow up annually.   Is UTD on pap smear with OBGYN (Dr. Maxie Better)  Family hx of colon cancer (paternal grandfather). Followed by [facility]. Last colonoscopy was 02/13/21 which should small diverticula. Recommended repeat colonoscopy in 10 years.      The following portions of the patient's history were reviewed and updated as appropriate: past medical history, past surgical history, family history, social  history, allergies, medications, and problem list.   Patient Active Problem List   Diagnosis Date Noted   Partial symptomatic epilepsy with simple partial seizures, not intractable, without status epilepticus (HCC) 07/10/2023   Family history of breast cancer in first degree relative 07/10/2023   Family history of colon cancer 07/10/2023   Insomnia 07/10/2023   Seizure disorder (HCC)    Past Medical History:  Diagnosis Date   Anxiety    Attempted suicide (HCC) 10/2003   Bulimia    HISTORY OF   Diverticulitis    Family history of colonic polyps 07/10/2023   Family history of malignant neoplasm of digestive organs 07/10/2023   Hx of varicella    Melanoma (HCC)    Polyhydramnios    Postpartum care following cesarean delivery (1/30) 12/05/2013   Rubella immune 2009   Seizure disorder (HCC) 03/2004   [facility] in [place]   Seizures [facility])    Last seizure 01/2020   Vitamin D deficiency    Past Surgical History:  Procedure Laterality Date   CESAREAN SECTION N/A 12/04/2013   Procedure: CESAREAN SECTION;  Surgeon: Serita Kyle, MD;  Location: [facility];  Service: Obstetrics;  Laterality: N/A;   CESAREAN SECTION N/A 10/12/2016   Procedure: CESAREAN SECTION;  Surgeon: Maxie Better, MD;  Location: [facility];  Service: Obstetrics;  Laterality: N/A;   COLONOSCOPY  02/2021   WISDOM TOOTH EXTRACTION  06/2004   Family History  Problem Relation Age of Onset   Breast cancer Mother 44   Colon polyps Mother    Breast cancer Paternal Aunt    Cancer Paternal Aunt  OVARIAN OR UTERINE   Breast cancer Maternal Grandmother    Heart disease Maternal Grandfather    Hyperlipidemia Maternal Grandfather    Colon polyps Maternal Grandfather    Colon cancer Neg Hx    Esophageal cancer Neg Hx    Stomach cancer Neg Hx     Current Outpatient Medications:    ibuprofen (ADVIL,MOTRIN) 600 MG tablet, Take 1 tablet (600 mg total) by mouth every 6 (six) hours., Disp: 30  tablet, Rfl: 0   lamoTRIgine (LAMICTAL) 100 MG tablet, Take 400 mg by mouth at bedtime., Disp: , Rfl:    Multiple Vitamin (MULTIVITAMIN) capsule, Take 1 capsule by mouth daily., Disp: , Rfl:    topiramate (TOPAMAX) 100 MG tablet, Take 100 mg by mouth daily., Disp: , Rfl:  Allergies  Allergen Reactions   Darvocet [Propoxyphene N-Acetaminophen] Nausea And Vomiting    ROS: A complete ROS was performed with pertinent positives/negatives noted in the HPI. The remainder of the ROS are negative.   Objective:   Today's Vitals   07/10/23 0822  BP: 112/68  Pulse: 76  Temp: 98.4 F (36.9 C)  TempSrc: Temporal  SpO2: 99%  Weight: 126 lb 6.4 oz (57.3 kg)  Height: 5\' 1"  (1.549 m)    GENERAL: Well-appearing, in NAD. Well nourished.  SKIN: Pink, warm and dry.   NECK: Trachea midline. Full ROM w/o pain or tenderness. No lymphadenopathy.  RESPIRATORY: Chest wall symmetrical. Respirations even and non-labored. Breath sounds clear to auscultation bilaterally.  CARDIAC: S1, S2 present, regular rate and rhythm. Peripheral pulses 2+ bilaterally.   EXTREMITIES: Without clubbing, cyanosis, or edema.  NEUROLOGIC: No motor or sensory deficits. Steady, even gait.  PSYCH/MENTAL STATUS: Alert, oriented x 3. Cooperative, appropriate mood and affect.   Health Maintenance Due  Topic Date Due   Hepatitis C Screening  Never done   DTaP/Tdap/Td (1 - Tdap) Never done   HPV VACCINES (2 - 3-dose series) 11/10/2006   PAP SMEAR-Modifier  10/10/2014   COVID-19 Vaccine (3 - Pfizer risk series) 03/20/2020     Assessment & Plan:  1. Partial symptomatic epilepsy with simple partial seizures, not intractable, without status epilepticus (HCC) -Continue routine follow-up with neurology  2. Hypoglycemia - Basic Metabolic Panel (BMET) - TSH  3. Flu vaccine need - Flu Vaccine QUAD 6+ mos PF IM (Fluarix Quad PF)  4. Family history of breast cancer in first degree relative -Continue routine mammogram screenings  with high risk clinic  5. Family history of colon cancer -Continue colonoscopy screenings every 10 years   Return in about 3 months (around 10/09/2023) for Fasting Annual Physical Exam.   Salvatore Decent, FNP

## 2023-07-19 NOTE — Addendum Note (Signed)
Addended by: Mary Sella D on: 07/19/2023 09:22 AM   Modules accepted: Orders

## 2023-07-23 ENCOUNTER — Encounter: Payer: Self-pay | Admitting: Internal Medicine

## 2023-10-09 ENCOUNTER — Ambulatory Visit: Payer: 59 | Admitting: Internal Medicine

## 2023-10-09 ENCOUNTER — Encounter: Payer: Self-pay | Admitting: Internal Medicine

## 2023-10-09 VITALS — BP 104/66 | HR 75 | Temp 98.6°F | Ht 60.25 in | Wt 124.6 lb

## 2023-10-09 DIAGNOSIS — Z23 Encounter for immunization: Secondary | ICD-10-CM | POA: Diagnosis not present

## 2023-10-09 DIAGNOSIS — Z0000 Encounter for general adult medical examination without abnormal findings: Secondary | ICD-10-CM | POA: Diagnosis not present

## 2023-10-09 LAB — COMPREHENSIVE METABOLIC PANEL
ALT: 28 U/L (ref 0–35)
AST: 20 U/L (ref 0–37)
Albumin: 4.5 g/dL (ref 3.5–5.2)
Alkaline Phosphatase: 49 U/L (ref 39–117)
BUN: 20 mg/dL (ref 6–23)
CO2: 23 meq/L (ref 19–32)
Calcium: 8.9 mg/dL (ref 8.4–10.5)
Chloride: 107 meq/L (ref 96–112)
Creatinine, Ser: 0.84 mg/dL (ref 0.40–1.20)
GFR: 85.54 mL/min (ref >60.00)
Glucose, Bld: 84 mg/dL (ref 70–99)
Potassium: 4.2 meq/L (ref 3.5–5.1)
Sodium: 136 meq/L (ref 135–145)
Total Bilirubin: 0.4 mg/dL (ref 0.2–1.2)
Total Protein: 7.2 g/dL (ref 6.0–8.3)

## 2023-10-09 LAB — CBC WITH DIFFERENTIAL/PLATELET
Basophils Absolute: 0 10*3/uL (ref 0.0–0.1)
Basophils Relative: 0.8 % (ref 0.0–3.0)
Eosinophils Absolute: 0.1 10*3/uL (ref 0.0–0.7)
Eosinophils Relative: 1.3 % (ref 0.0–5.0)
HCT: 40.4 % (ref 36.0–46.0)
Hemoglobin: 13.3 g/dL (ref 12.0–15.0)
Lymphocytes Relative: 26.6 % (ref 12.0–46.0)
Lymphs Abs: 1.4 10*3/uL (ref 0.7–4.0)
MCHC: 32.8 g/dL (ref 30.0–36.0)
MCV: 92.5 fL (ref 78.0–100.0)
Monocytes Absolute: 0.5 10*3/uL (ref 0.1–1.0)
Monocytes Relative: 8.8 % (ref 3.0–12.0)
Neutro Abs: 3.4 10*3/uL (ref 1.4–7.7)
Neutrophils Relative %: 62.5 % (ref 43.0–77.0)
Platelets: 276 10*3/uL (ref 150.0–400.0)
RBC: 4.37 Mil/uL (ref 3.87–5.11)
RDW: 12.8 % (ref 11.5–15.5)
WBC: 5.4 10*3/uL (ref 4.0–10.5)

## 2023-10-09 LAB — LIPID PANEL
Cholesterol: 168 mg/dL (ref 0–200)
HDL: 62.9 mg/dL (ref >39.00)
LDL Cholesterol: 97 mg/dL (ref 0–99)
NonHDL: 105.38
Total CHOL/HDL Ratio: 3
Triglycerides: 40 mg/dL (ref 0.0–149.0)
VLDL: 8 mg/dL (ref 0.0–40.0)

## 2023-10-09 LAB — TSH: TSH: 3.31 u[IU]/mL (ref 0.35–5.50)

## 2023-10-09 NOTE — Progress Notes (Signed)
Subjective:   Natasha Watson [birth year]  10/09/2023   CC: Chief Complaint  Patient presents with   Annual Exam    HPI: Natasha Watson is a 42 y.o. female who presents for a routine health maintenance exam.  Labs collected at time of visit.   Discussed the use of AI scribe software for clinical note transcription with the patient, who gave verbal consent to proceed.  History of Present Illness   The patient presents for a routine check-up. She recently had a mammogram and a Pap smear with her OB/GYN, Dr. Cherly Hensen. The results of the Pap smear are pending, and the patient will request that they be sent over. She declined to have blood work done at her OB/GYN's office, preferring to have it done during this visit.   The patient does not recall when she last had a tetanus shot and agreed to have it updated during this visit. She also inquired about the availability of the updated COVID-19 vaccine, which is currently not available at the office.  The patient has no new complaints or concerns and is generally in good health. She has been compliant with her regular screenings and check-ups.        HEALTH SCREENINGS: - Pap smear: up to date - had with OBGYN last week, awaiting results. Patient will have OBGYN fax over results.  - Mammogram (40+): Up to date  - Colonoscopy (45+): 2022, due 10 years - Bone Density (65+): Not applicable  - Lung CA screening with low-dose CT:  Not applicable Adults age 42-80 who are current cigarette smokers or quit within the last 15 years. Must have 20 pack year history.   Depression and Anxiety Screen done today and results listed below:     10/09/2023    8:19 AM 07/10/2023    8:30 AM  Depression screen PHQ 2/9  Decreased Interest 0 0  Down, Depressed, Hopeless 0 0  PHQ - 2 Score 0 0  Altered sleeping 0 0  Tired, decreased energy 0 0  Change in appetite 0 0  Feeling bad or failure about yourself  0 0  Trouble concentrating 1 0  Moving  slowly or fidgety/restless 0 0  Suicidal thoughts 0 0  PHQ-9 Score 1 0  Difficult doing work/chores Not difficult at all Not difficult at all      10/09/2023    8:20 AM 07/10/2023    8:30 AM  GAD 7 : Generalized Anxiety Score  Nervous, Anxious, on Edge 0 0  Control/stop worrying 0 0  Worry too much - different things 0 0  Trouble relaxing 1 2  Restless 1 2  Easily annoyed or irritable 0 0  Afraid - awful might happen 0 0  Total GAD 7 Score 2 4  Anxiety Difficulty Not difficult at all Not difficult at all    IMMUNIZATIONS: - Tdap: Tetanus vaccination status reviewed: Td vaccination indicated and given today. - HPV: Not applicable - Influenza: Up to date   Past medical history, surgical history, medications, allergies, family history and social history reviewed with patient today and changes made to appropriate areas of the chart.   Past Medical History:  Diagnosis Date   Anxiety    Attempted suicide (HCC) 10/2003   Bulimia    HISTORY OF   Diverticulitis    Family history of colonic polyps 07/10/2023   Family history of malignant neoplasm of digestive organs 07/10/2023   Hx of varicella    Melanoma (HCC)  Polyhydramnios    Postpartum care following cesarean delivery (1/30) 12/05/2013   Rubella immune 2009   Seizure disorder (HCC) 03/2004   [facility] in [place]   Seizures [facility])    Last seizure 01/2020   Vitamin D deficiency     Past Surgical History:  Procedure Laterality Date   CESAREAN SECTION N/A 12/04/2013   Procedure: CESAREAN SECTION;  Surgeon: Serita Kyle, MD;  Location: [facility];  Service: Obstetrics;  Laterality: N/A;   CESAREAN SECTION N/A 10/12/2016   Procedure: CESAREAN SECTION;  Surgeon: Maxie Better, MD;  [facility]: [facility];  Service: Obstetrics;  Laterality: N/A;   COLONOSCOPY  02/2021   WISDOM TOOTH EXTRACTION  06/2004    Current Outpatient Medications on File Prior to Visit  Medication Sig   ibuprofen (ADVIL,MOTRIN)  600 MG tablet Take 1 tablet (600 mg total) by mouth every 6 (six) hours.   lamoTRIgine (LAMICTAL) 100 MG tablet Take 100 mg by mouth 4 (four) times daily.   Multiple Vitamin (MULTIVITAMIN) capsule Take 1 capsule by mouth daily.   topiramate (TOPAMAX) 100 MG tablet Take 100 mg by mouth daily.   lamoTRIgine (LAMICTAL) 100 MG tablet Take 400 mg by mouth at bedtime.   No current facility-administered medications on file prior to visit.    Allergies  Allergen Reactions   Darvocet [Propoxyphene N-Acetaminophen] Nausea And Vomiting     Social History   Socioeconomic History   Marital status: Married    Spouse name: Not on file   Number of children: 2   Years of education: Not on file   Highest education level: Bachelor's degree (e.g., BA, AB, BS)  Occupational History   Occupation: ADMN ASSIST    Employer: [employer]  Tobacco Use   Smoking status: Former    Current packs/day: 0.00    Types: Cigarettes    Quit date: 10/10/2001    Years since quitting: 22.0   Smokeless tobacco: Never  Substance and Sexual Activity   Alcohol use: Yes    Comment: rarely   Drug use: No   Sexual activity: Yes    Partners: Male    Birth control/protection: None  Other Topics Concern   Not on file  Social History Narrative   Married   Paralegal   2 sons, 2015 and 2017   No or rare EtOH, formr smoker, no drugs   Social Determinants of Health   Financial Resource Strain: Low Risk  (10/06/2023)   Overall Financial Resource Strain (CARDIA)    Difficulty of Paying Living Expenses: Not hard at all  Food Insecurity: No Food Insecurity (10/06/2023)   Hunger Vital Sign    Worried About Running Out of Food in the Last Year: Never true    Ran Out of Food in the Last Year: Never true  Transportation Needs: No Transportation Needs (10/06/2023)   PRAPARE - Administrator, Civil Service (Medical): No    Lack of Transportation (Non-Medical): No  Physical Activity: Sufficiently Active  (10/06/2023)   Exercise Vital Sign    Days of Exercise per Week: 5 days    Minutes of Exercise per Session: 60 min  Stress: No Stress Concern Present (10/06/2023)   [employer] of Occupational Health - Occupational Stress Questionnaire    Feeling of Stress : Not at all  Social Connections: Moderately Integrated (10/06/2023)   Social Connection and Isolation Panel [NHANES]    Frequency of Communication with Friends and Family: More than three times a week  Frequency of Social Gatherings with Friends and Family: Three times a week    Attends Religious Services: Never    Active Member of Clubs or Organizations: Yes    Attends Banker Meetings: 1 to 4 times per year    Marital Status: Married  Catering manager Violence: Unknown (02/05/2022)   Received from [employer], [facility]   HITS    Physically Hurt: Not on file    Insult or Talk Down To: Not on file    Threaten Physical Harm: Not on file    Scream or Curse: Not on file   Social History   Tobacco Use  Smoking Status Former   Current packs/day: 0.00   Types: Cigarettes   Quit date: 10/10/2001   Years since quitting: 22.0  Smokeless Tobacco Never   Social History   Substance and Sexual Activity  Alcohol Use Yes   Comment: rarely    Family History  Problem Relation Age of Onset   Breast cancer Mother 27   Colon polyps Mother    Breast cancer Paternal Aunt    Cancer Paternal Aunt        OVARIAN OR UTERINE   Breast cancer Maternal Grandmother    Heart disease Maternal Grandfather    Hyperlipidemia Maternal Grandfather    Colon polyps Maternal Grandfather    Colon cancer Neg Hx    Esophageal cancer Neg Hx    Stomach cancer Neg Hx      ROS: Denies fever, fatigue, unexplained weight loss/gain, hearing or vision changes, cardiac or respiratory complaints. Denies neurological deficits, musculoskeletal complaints, gastrointestinal or genitourinary complaints, mental health complaints, and skin  changes.   Objective:   Today's Vitals   10/09/23 0816  BP: 104/66  Pulse: 75  Temp: 98.6 F (37 C)  TempSrc: Temporal  SpO2: 99%  Weight: 124 lb 9.6 oz (56.5 kg)  Height: 5' 0.25" (1.53 m)    GENERAL APPEARANCE: Well-appearing, in NAD. Well nourished.  SKIN: Pink, warm and dry. Turgor normal. No rash, lesion, ulceration, or ecchymoses. Hair evenly distributed.  HEENT: HEAD: Normocephalic.  EYES: PERRLA. EOMI. Lids intact w/o defect. Sclera white, Conjunctiva pink w/o exudate.  EARS: External ear w/o redness, swelling, masses or lesions. EAC clear. TM's intact, translucent w/o bulging, appropriate landmarks visualized. Appropriate acuity to conversational tones.  NOSE: Septum midline w/o deformity. Nares patent, mucosa pink and non-inflamed w/o drainage. No sinus tenderness.  THROAT: Uvula midline. Oropharynx clear. Tonsils non-inflamed w/o exudate. Oral mucosa pink and moist.  NECK: Supple, Trachea midline. Full ROM w/o pain or tenderness. No lymphadenopathy. Thyroid non-tender w/o enlargement or palpable masses.  BREASTS: deferred exam.  RESPIRATORY: Chest wall symmetrical w/o masses. Respirations even and non-labored. Breath sounds clear to auscultation bilaterally. No wheezes, rales, rhonchi, or crackles. CARDIAC: S1, S2 present, regular rate and rhythm. No gallops, murmurs, rubs, or clicks. Capillary refill <2 seconds. Peripheral pulses 2+ bilaterally. GI: Abdomen soft w/o distention. Normoactive bowel sounds. No palpable masses or tenderness. No guarding or rebound tenderness. Liver and spleen w/o tenderness or enlargement. No CVA tenderness.  MSK: Muscle tone and strength appropriate for age, w/o atrophy or abnormal movement.  EXTREMITIES: Active ROM intact, w/o tenderness, crepitus, or contracture. No obvious joint deformities or effusions. No clubbing, edema, or cyanosis.  NEUROLOGIC: Motor strength symmetrical with no obvious weakness. No sensory deficits. Steady, even gait.   PSYCH/MENTAL STATUS: Alert, oriented x 3. Cooperative, appropriate mood and affect.     Assessment & Plan:  Assessment and  Plan    Encounter for annual exam without abnormal findings Up to date on mammogram. Recent Pap smear performed by OB/GYN, results pending. Discussed the importance of consistent communication between all healthcare providers. -Request Pap smear results from OB/GYN. -Perform blood work today and send results to OB/GYN.   Tetanus Immunization Uncertain of last tetanus shot, likely overdue. -Administer tetanus shot today.        Orders Placed This Encounter  Procedures   Tdap vaccine greater than or equal to 7yo IM   CBC with Differential/Platelet   Comprehensive metabolic panel   TSH   Lipid panel    PATIENT COUNSELING:  - healthy well-balanced diet. Patient may adjust caloric intake to maintain or achieve ideal body weight. May reduce intake of dietary saturated fat and total fat and have adequate dietary potassium and calcium preferably from fresh fruits, vegetables, and low-fat dairy products.   - importance of regular exercise  NEXT PREVENTATIVE PHYSICAL DUE IN 1 YEAR.  Return in about 1 year (around 10/08/2024) for Fasting Annual Physical Exam.  Salvatore Decent, FNP

## 2024-08-19 ENCOUNTER — Ambulatory Visit: Admitting: Physician Assistant

## 2024-08-19 ENCOUNTER — Encounter: Payer: Self-pay | Admitting: Physician Assistant

## 2024-08-19 VITALS — BP 110/70 | HR 82

## 2024-08-19 DIAGNOSIS — W908XXA Exposure to other nonionizing radiation, initial encounter: Secondary | ICD-10-CM | POA: Diagnosis not present

## 2024-08-19 DIAGNOSIS — D1801 Hemangioma of skin and subcutaneous tissue: Secondary | ICD-10-CM

## 2024-08-19 DIAGNOSIS — Z1283 Encounter for screening for malignant neoplasm of skin: Secondary | ICD-10-CM | POA: Diagnosis not present

## 2024-08-19 DIAGNOSIS — D229 Melanocytic nevi, unspecified: Secondary | ICD-10-CM

## 2024-08-19 DIAGNOSIS — L821 Other seborrheic keratosis: Secondary | ICD-10-CM

## 2024-08-19 DIAGNOSIS — Z86018 Personal history of other benign neoplasm: Secondary | ICD-10-CM

## 2024-08-19 DIAGNOSIS — L814 Other melanin hyperpigmentation: Secondary | ICD-10-CM

## 2024-08-19 DIAGNOSIS — L578 Other skin changes due to chronic exposure to nonionizing radiation: Secondary | ICD-10-CM | POA: Diagnosis not present

## 2024-08-19 NOTE — Progress Notes (Signed)
   New Patient Visit   Subjective  Natasha Watson is a 43 y.o. female NEW PATIENT who presents for the following:  Total Body Skin Exam (TBSE)  Patient present today for new patient visit for TBSE.The patient denies she has spots, moles and lesions to be evaluated, some may be new or changing and the patient may have concern these could be cancer. Patient has previously been treated by a dermatologist.Patient reports she has hx of bx. Patient denies family history of skin cancers. Patient reports throughout her lifetime has had moderate sun exposure. Currently, patient reports if she has excessive sun exposure, she does apply sunscreen and/or wears protective coverings.  Past dermatology history includes (but will confirm) dysplastic nevus on left forearm that was previously excised.   The following portions of the chart were reviewed this encounter and updated as appropriate: medications, allergies, medical history  Review of Systems:  No other skin or systemic complaints except as noted in HPI or Assessment and Plan.  Objective  Well appearing patient in no apparent distress; mood and affect are within normal limits.  A full examination was performed including scalp, head, eyes, ears, nose, lips, neck, chest, axillae, abdomen, back, buttocks, bilateral upper extremities, bilateral lower extremities, hands, feet, fingers, toes, fingernails, and toenails. All findings within normal limits unless otherwise noted below.     Relevant exam findings are noted in the Assessment and Plan.    Assessment & Plan   LENTIGINES, SEBORRHEIC KERATOSES, HEMANGIOMAS - Benign normal skin lesions - Benign-appearing - Call for any changes  MELANOCYTIC NEVI - Tan-brown and/or pink-flesh-colored symmetric macules and papules - Benign appearing on exam today - Observation - Call clinic for new or changing moles - Recommend daily use of broad spectrum spf 30+ sunscreen to sun-exposed areas.   ACTINIC  DAMAGE - Chronic condition, secondary to cumulative UV/sun exposure - diffuse scaly erythematous macules with underlying dyspigmentation - Recommend daily broad spectrum sunscreen SPF 30+ to sun-exposed areas, reapply every 2 hours as needed.  - Staying in the shade or wearing long sleeves, sun glasses (UVA+UVB protection) and wide brim hats (4-inch brim around the entire circumference of the hat) are also recommended for sun protection.  - Call for new or changing lesions.  SKIN CANCER SCREENING PERFORMED TODAY  HISTORY OF DYSPLASTIC NEVUS No evidence of recurrence today Recommend regular full body skin exams Recommend daily broad spectrum sunscreen SPF 30+ to sun-exposed areas, reapply every 2 hours as needed.  Call if any new or changing lesions are noted between office visits  HISTORY OF DYSPLASTIC NEVUS   ACTINIC SKIN DAMAGE   SCREENING EXAM FOR SKIN CANCER   MULTIPLE BENIGN NEVI   LENTIGINES   SEBORRHEIC KERATOSIS   CHERRY ANGIOMA    Return in about 1 year (around 08/19/2025) for TBSE.  I, Doyce Pan, CMA, am acting as scribe for Clebert Wenger K, PA-C.   Documentation: I have reviewed the above documentation for accuracy and completeness, and I agree with the above.  Eleanor Gatliff K, PA-C

## 2024-08-19 NOTE — Patient Instructions (Signed)

## 2024-08-19 NOTE — Addendum Note (Signed)
 Addended by: ORMAN AMERICA on: 08/19/2024 10:17 AM   Modules accepted: Orders

## 2024-09-23 ENCOUNTER — Ambulatory Visit: Admitting: Physician Assistant

## 2024-10-12 ENCOUNTER — Encounter: Payer: 59 | Admitting: Internal Medicine

## 2024-11-25 ENCOUNTER — Encounter: Payer: Self-pay | Admitting: Internal Medicine

## 2024-11-25 ENCOUNTER — Ambulatory Visit (INDEPENDENT_AMBULATORY_CARE_PROVIDER_SITE_OTHER): Admitting: Internal Medicine

## 2024-11-25 VITALS — BP 110/72 | HR 77 | Temp 98.2°F | Ht 60.0 in | Wt 128.0 lb

## 2024-11-25 DIAGNOSIS — Z0000 Encounter for general adult medical examination without abnormal findings: Secondary | ICD-10-CM | POA: Diagnosis not present

## 2024-11-25 DIAGNOSIS — R92333 Mammographic heterogeneous density, bilateral breasts: Secondary | ICD-10-CM | POA: Insufficient documentation

## 2024-11-25 NOTE — Progress Notes (Signed)
 "  Subjective:   Natasha Watson 1981/09/19  11/25/2024   CC: Chief Complaint  Patient presents with   Annual Exam    Pt is not fasting, no concerns    HPI: Natasha Watson is a 44 y.o. female who presents for a routine health maintenance exam.  Labs not collected at time of visit. Patient will return for fasting lab work.    HEALTH SCREENINGS: - Pap smear: done with OBGYN - requesting records - Mammogram (40+): scheduled for Feb 2026 high risk - 1st degree relative BCA. Under Dr. Ebbie at [facility] surgery. Going to do breast MRI in July 2026. - Colonoscopy (45+): performed in 02/2021 at  GI, due 10 years  fam hx of colon cancer - Bone Density (65+): Not applicable  - Lung CA screening with low-dose CT:  Not applicable Adults age 76-80 who are current cigarette smokers or quit within the last 15 years. Must have 20 pack year history.   IMMUNIZATIONS: - Tdap: Tetanus vaccination status reviewed: last tetanus booster within 10 years. - HPV: Up to date - Influenza: Up to date   Past medical history, surgical history, medications, allergies, family history and social history reviewed with patient today and changes made to appropriate areas of the chart.   Social History   Socioeconomic History   Marital status: Married    Spouse name: Not on file   Number of children: 2   Years of education: Not on file   Highest education level: Bachelor's degree (e.g., BA, AB, BS)  Occupational History   Occupation: ADMN ASSIST    Employer: [employer]  Tobacco Use   Smoking status: Former    Current packs/day: 0.00    Types: Cigarettes    Quit date: 10/10/2001    Years since quitting: 23.1   Smokeless tobacco: Never  Substance and Sexual Activity   Alcohol use: Yes    Comment: 1 glass of wine a month at most. Sometimes none.   Drug use: Never   Sexual activity: Yes    Partners: Male    Birth control/protection: I.U.D., None  Other Topics Concern    Not on file  Social History Narrative   Married   Paralegal   2 sons, 2015 and 2017   No or rare EtOH, formr smoker, no drugs   Social Drivers of Health   Tobacco Use: Medium Risk (11/25/2024)   Patient History    Smoking Tobacco Use: Former    Smokeless Tobacco Use: Never    Passive Exposure: Not on file  Financial Resource Strain: Low Risk (11/21/2024)   Overall Financial Resource Strain (CARDIA)    Difficulty of Paying Living Expenses: Not hard at all  Food Insecurity: No Food Insecurity (11/21/2024)   Epic    Worried About Programme Researcher, Broadcasting/film/video in the Last Year: Never true    Ran Out of Food in the Last Year: Never true  Transportation Needs: No Transportation Needs (11/21/2024)   Epic    Lack of Transportation (Medical): No    Lack of Transportation (Non-Medical): No  Physical Activity: Sufficiently Active (11/21/2024)   Exercise Vital Sign    Days of Exercise per Week: 5 days    Minutes of Exercise per Session: 60 min  Stress: Stress Concern Present (11/21/2024)   [employer] of Occupational Health - Occupational Stress Questionnaire    Feeling of Stress: Rather much  Social Connections: Moderately Integrated (11/21/2024)   Social Connection and Isolation Panel  Frequency of Communication with Friends and Family: More than three times a week    Frequency of Social Gatherings with Friends and Family: Once a week    Attends Religious Services: Never    Database Administrator or Organizations: Yes    Attends Banker Meetings: 1 to 4 times per year    Marital Status: Married  Catering Manager Violence: Not on file  Depression (PHQ2-9): Low Risk (11/25/2024)   Depression (PHQ2-9)    PHQ-2 Score: 1  Alcohol Screen: Low Risk (11/21/2024)   Alcohol Screen    Last Alcohol Screening Score (AUDIT): 1  Housing: Low Risk (11/21/2024)   Epic    Unable to Pay for Housing in the Last Year: No    Number of Times Moved in the Last Year: 0    Homeless in the Last Year:  No  Utilities: Not on file  Health Literacy: Not on file     Past Medical History:  Diagnosis Date   Anxiety    Attempted suicide (HCC) 10/2003   Bulimia (HCC)    HISTORY OF   Diverticulitis    Family history of colonic polyps 07/10/2023   Family history of malignant neoplasm of digestive organs 07/10/2023   Hx of varicella    Melanoma (HCC)    Polyhydramnios    Postpartum care following cesarean delivery (1/30) 12/05/2013   Rubella immune 2009   Seizure disorder (HCC) 03/2004   [facility] in [place]   Seizures [facility])    Last seizure 01/2020   Vitamin D deficiency     Past Surgical History:  Procedure Laterality Date   CESAREAN SECTION N/A 12/04/2013   Procedure: CESAREAN SECTION;  Surgeon: Dickie DELENA Carder, MD;  Location: [facility];  Service: Obstetrics;  Laterality: N/A;   CESAREAN SECTION N/A 10/12/2016   Procedure: CESAREAN SECTION;  Surgeon: Dickie Carder, MD;  Location: [facility];  Service: Obstetrics;  Laterality: N/A;   COLONOSCOPY  02/2021   WISDOM TOOTH EXTRACTION  06/2004    Current Outpatient Medications on File Prior to Visit  Medication Sig   ibuprofen  (ADVIL ,MOTRIN ) 600 MG tablet Take 1 tablet (600 mg total) by mouth every 6 (six) hours. (Patient taking differently: Take 600 mg by mouth as needed.)   lamoTRIgine  (LAMICTAL ) 100 MG tablet Take 100 mg by mouth 4 (four) times daily.   Multiple Vitamin (MULTIVITAMIN) capsule Take 1 capsule by mouth daily.   topiramate (TOPAMAX) 100 MG tablet Take 100 mg by mouth daily.   No current facility-administered medications on file prior to visit.    Allergies[1]  Family History  Problem Relation Age of Onset   Breast cancer Mother 31   Colon polyps Mother    Depression Mother    Cancer Mother    Stroke Mother    Obesity Mother    Varicose Veins Mother    Breast cancer Paternal Aunt    Cancer Paternal Aunt        OVARIAN OR UTERINE   Breast cancer Maternal Grandmother    Cancer Maternal  Grandmother    Heart disease Maternal Grandfather    Hyperlipidemia Maternal Grandfather    Colon polyps Maternal Grandfather    Cancer Maternal Grandfather    Cancer Paternal Grandfather    Cancer Paternal Grandmother    ADD / ADHD Brother    ADD / ADHD Son    Cancer Paternal Aunt    Colon cancer Neg Hx    Esophageal cancer Neg Hx  Stomach cancer Neg Hx      ROS: Denies fever, fatigue, unexplained weight loss/gain, hearing or vision changes, cardiac or respiratory complaints. Denies neurological deficits, musculoskeletal complaints, gastrointestinal or genitourinary complaints, mental health complaints, and skin changes.   Objective:   Today's Vitals   11/25/24 1335  BP: 110/72  Pulse: 77  Temp: 98.2 F (36.8 C)  TempSrc: Temporal  SpO2: 99%  Weight: 128 lb (58.1 kg)  Height: 5' (1.524 m)    GENERAL APPEARANCE: Well-appearing, in NAD. Well nourished.  SKIN: Pink, warm and dry. Turgor normal. No rash, lesion, ulceration, or ecchymoses. Hair evenly distributed.  HEENT: HEAD: Normocephalic.  EYES: PERRLA. EOMI. Lids intact w/o defect. Sclera white, Conjunctiva pink w/o exudate.  EARS: External ear w/o redness, swelling, masses or lesions. EAC clear. TM's intact, translucent w/o bulging, appropriate landmarks visualized. Appropriate acuity to conversational tones.  NOSE: Septum midline w/o deformity. Nares patent, mucosa pink and non-inflamed w/o drainage.  THROAT: Uvula midline. Oropharynx clear. Tonsils non-inflamed w/o exudate. Oral mucosa pink and moist.  NECK: Supple, Trachea midline. Full ROM w/o pain or tenderness. No lymphadenopathy. Thyroid  non-tender w/o enlargement or palpable masses.  RESPIRATORY: Chest wall symmetrical w/o masses. Respirations even and non-labored. Breath sounds clear to auscultation bilaterally. No wheezes, rales, rhonchi, or crackles. CARDIAC: S1, S2 present, regular rate and rhythm. No gallops, murmurs, rubs, or clicks.  Capillary refill <2  seconds. Peripheral pulses 2+ bilaterally. GI: Abdomen soft w/o distention. Normoactive bowel sounds. No palpable masses or tenderness. No guarding or rebound tenderness. Liver and spleen w/o tenderness or enlargement. No CVA tenderness.   MSK: Muscle tone and strength appropriate for age, w/o atrophy or abnormal movement.  EXTREMITIES: Active ROM intact, w/o tenderness, crepitus, or contracture. No obvious joint deformities or effusions. No clubbing, edema, or cyanosis.  NEUROLOGIC: CN's II-XII intact. Motor strength symmetrical with no obvious weakness. No sensory deficits. Steady, even gait.  PSYCH/MENTAL STATUS: Alert, oriented x 3. Cooperative, appropriate mood and affect.    Depression and Anxiety Screen done today and results listed below:     11/25/2024    1:39 PM 10/09/2023    8:19 AM 07/10/2023    8:30 AM  Depression screen PHQ 2/9  Decreased Interest 0 0 0  Down, Depressed, Hopeless 0 0 0  PHQ - 2 Score 0 0 0  Altered sleeping 1 0 0  Tired, decreased energy 0 0 0  Change in appetite 0 0 0  Feeling bad or failure about yourself  0 0 0  Trouble concentrating 0 1 0  Moving slowly or fidgety/restless 0 0 0  Suicidal thoughts 0 0 0  PHQ-9 Score 1 1  0   Difficult doing work/chores Not difficult at all Not difficult at all Not difficult at all     Data saved with a previous flowsheet row definition      11/25/2024    1:39 PM 10/09/2023    8:20 AM 07/10/2023    8:30 AM  GAD 7 : Generalized Anxiety Score  Nervous, Anxious, on Edge 0 0  0   Control/stop worrying 0 0  0   Worry too much - different things 0 0  0   Trouble relaxing 2 1  2    Restless 2 1  2    Easily annoyed or irritable 0 0  0   Afraid - awful might happen 0 0  0   Total GAD 7 Score 4 2 4   Anxiety Difficulty Not difficult at all Not difficult  at all Not difficult at all     Data saved with a previous flowsheet row definition     Assessment & Plan:  Annual physical exam -     CBC with Differential/Platelet;  Future -     Comprehensive metabolic panel with GFR; Future -     Lipid panel; Future -     TSH; Future    Orders Placed This Encounter  Procedures   CBC with Differential/Platelet    Standing Status:   Future    Expiration Date:   05/25/2025   Comprehensive metabolic panel with GFR    Standing Status:   Future    Expiration Date:   05/25/2025   Lipid panel    Standing Status:   Future    Expiration Date:   05/25/2025   TSH    Standing Status:   Future    Expiration Date:   05/25/2025    PATIENT COUNSELING:  - Encourage a healthy well-balanced diet. Patient may adjust caloric intake to maintain or achieve ideal body weight. May reduce intake of dietary saturated fat and total fat and have adequate dietary potassium and calcium preferably from fresh fruits, vegetables, and low-fat dairy products.    - Stress the importance of regular exercise  NEXT PREVENTATIVE PHYSICAL DUE IN 1 YEAR.  Return in about 1 year (around 11/25/2025) for Annual Physical Exam with fasting lab work.  [name] Senters, FNP     [1]  Allergies Allergen Reactions   Darvocet [Propoxyphene N-Acetaminophen ] Nausea And Vomiting   "

## 2024-12-03 ENCOUNTER — Other Ambulatory Visit

## 2024-12-04 ENCOUNTER — Ambulatory Visit: Payer: Self-pay | Admitting: Internal Medicine

## 2024-12-04 ENCOUNTER — Other Ambulatory Visit

## 2024-12-04 DIAGNOSIS — Z13220 Encounter for screening for lipoid disorders: Secondary | ICD-10-CM | POA: Diagnosis not present

## 2024-12-04 DIAGNOSIS — Z0000 Encounter for general adult medical examination without abnormal findings: Secondary | ICD-10-CM

## 2024-12-04 LAB — TSH: TSH: 3.06 u[IU]/mL (ref 0.35–5.50)

## 2024-12-04 LAB — CBC WITH DIFFERENTIAL/PLATELET
Basophils Absolute: 0 10*3/uL (ref 0.0–0.1)
Basophils Relative: 0.6 % (ref 0.0–3.0)
Eosinophils Absolute: 0 10*3/uL (ref 0.0–0.7)
Eosinophils Relative: 1.2 % (ref 0.0–5.0)
HCT: 38.5 % (ref 36.0–46.0)
Hemoglobin: 12.8 g/dL (ref 12.0–15.0)
Lymphocytes Relative: 39.6 % (ref 12.0–46.0)
Lymphs Abs: 1.6 10*3/uL (ref 0.7–4.0)
MCHC: 33.1 g/dL (ref 30.0–36.0)
MCV: 88.9 fl (ref 78.0–100.0)
Monocytes Absolute: 0.3 10*3/uL (ref 0.1–1.0)
Monocytes Relative: 8.3 % (ref 3.0–12.0)
Neutro Abs: 2.1 10*3/uL (ref 1.4–7.7)
Neutrophils Relative %: 50.3 % (ref 43.0–77.0)
Platelets: 287 10*3/uL (ref 150.0–400.0)
RBC: 4.33 Mil/uL (ref 3.87–5.11)
RDW: 13 % (ref 11.5–15.5)
WBC: 4.1 10*3/uL (ref 4.0–10.5)

## 2024-12-04 LAB — LIPID PANEL
Cholesterol: 154 mg/dL (ref 28–200)
HDL: 62.4 mg/dL
LDL Cholesterol: 85 mg/dL (ref 10–99)
NonHDL: 92.01
Total CHOL/HDL Ratio: 2
Triglycerides: 34 mg/dL (ref 10.0–149.0)
VLDL: 6.8 mg/dL (ref 0.0–40.0)

## 2024-12-04 LAB — COMPREHENSIVE METABOLIC PANEL WITH GFR
ALT: 21 U/L (ref 3–35)
AST: 18 U/L (ref 5–37)
Albumin: 4.4 g/dL (ref 3.5–5.2)
Alkaline Phosphatase: 43 U/L (ref 39–117)
BUN: 20 mg/dL (ref 6–23)
CO2: 24 meq/L (ref 19–32)
Calcium: 9.3 mg/dL (ref 8.4–10.5)
Chloride: 106 meq/L (ref 96–112)
Creatinine, Ser: 0.89 mg/dL (ref 0.40–1.20)
GFR: 79.16 mL/min
Glucose, Bld: 86 mg/dL (ref 70–99)
Potassium: 4.1 meq/L (ref 3.5–5.1)
Sodium: 138 meq/L (ref 135–145)
Total Bilirubin: 0.3 mg/dL (ref 0.2–1.2)
Total Protein: 7 g/dL (ref 6.0–8.3)

## 2024-12-04 NOTE — Progress Notes (Signed)
 Hi Natasha Watson, Your thyroid  level is within normal range.  Your cholesterol is within normal range.  Your electrolytes, kidney function, liver function, and blood counts are all stable.  [name]

## 2024-12-10 ENCOUNTER — Telehealth: Payer: Self-pay

## 2024-12-10 NOTE — Telephone Encounter (Signed)
Patient has been notified directly; all questions, if any, were answered. Patient voiced understanding.   

## 2025-08-23 ENCOUNTER — Ambulatory Visit: Admitting: Physician Assistant

## 2025-08-25 ENCOUNTER — Ambulatory Visit: Admitting: Physician Assistant

## 2025-11-26 ENCOUNTER — Encounter: Admitting: Internal Medicine
